# Patient Record
Sex: Female | Born: 1970 | Race: White | Hispanic: No | Marital: Married | Smoking: Current every day smoker
Health system: Southern US, Community
[De-identification: ages and names within clinical notes are randomized; demographics above are authoritative.]

## PROBLEM LIST (undated history)

## (undated) DIAGNOSIS — E119 Type 2 diabetes mellitus without complications: Secondary | ICD-10-CM

## (undated) HISTORY — DX: Type 2 diabetes mellitus without complications: E11.9

---

## 2009-06-22 ENCOUNTER — Ambulatory Visit: Payer: Self-pay | Admitting: Internal Medicine

## 2011-02-18 ENCOUNTER — Ambulatory Visit: Payer: Self-pay

## 2011-05-15 ENCOUNTER — Ambulatory Visit: Payer: Self-pay | Admitting: Internal Medicine

## 2019-07-02 ENCOUNTER — Ambulatory Visit: Payer: Self-pay | Attending: Oncology

## 2019-07-02 DIAGNOSIS — Z23 Encounter for immunization: Secondary | ICD-10-CM

## 2019-07-02 NOTE — Progress Notes (Signed)
   Covid-19 Vaccination Clinic  Name:  Sally Graham    MRN: [id number] DOB: 11-05-1970  07/02/2019  [name] was observed post Covid-19 immunization for 15 minutes without incident. She was provided with Vaccine Information Sheet and instruction to access the V-Safe system.   [name] was instructed to call 911 with any severe reactions post vaccine: Marland Kitchen Difficulty breathing  . Swelling of face and throat  . A fast heartbeat  . A bad rash all over body  . Dizziness and weakness   Immunizations Administered    Name Date Dose VIS Date Route   Pfizer COVID-19 Vaccine 07/02/2019  8:19 AM 0.3 mL 02/26/2019 Intramuscular   Manufacturer: [place]   Lot: [id number]   [place]: [id number]

## 2019-07-24 ENCOUNTER — Ambulatory Visit: Payer: Self-pay | Attending: Internal Medicine

## 2019-07-24 ENCOUNTER — Other Ambulatory Visit: Payer: Self-pay

## 2019-07-24 DIAGNOSIS — Z23 Encounter for immunization: Secondary | ICD-10-CM

## 2019-07-24 NOTE — Progress Notes (Signed)
   Covid-19 Vaccination Clinic  Name:  Sally Graham    MRN: [id number] DOB: 1971/03/02  07/24/2019  [name] was observed post Covid-19 immunization for 15 minutes without incident. She was provided with Vaccine Information Sheet and instruction to access the V-Safe system.   [name] was instructed to call 911 with any severe reactions post vaccine: Marland Kitchen Difficulty breathing  . Swelling of face and throat  . A fast heartbeat  . A bad rash all over body  . Dizziness and weakness   Immunizations Administered    Name Date Dose VIS Date Route   Pfizer COVID-19 Vaccine 07/24/2019 12:40 PM 0.3 mL 05/12/2018 Intramuscular   Manufacturer: [place]   Lot: [id number]   [place]: [id number]

## 2021-04-17 ENCOUNTER — Other Ambulatory Visit: Payer: Self-pay

## 2021-04-17 ENCOUNTER — Ambulatory Visit: Payer: BC Managed Care – PPO | Admitting: Internal Medicine

## 2021-04-17 ENCOUNTER — Ambulatory Visit
Admission: RE | Admit: 2021-04-17 | Discharge: 2021-04-17 | Disposition: A | Discharge status: Home / Self Care | Payer: BC Managed Care – PPO | Source: Ambulatory Visit | Attending: Internal Medicine | Admitting: Internal Medicine

## 2021-04-17 ENCOUNTER — Ambulatory Visit
Admission: RE | Admit: 2021-04-17 | Discharge: 2021-04-17 | Disposition: A | Discharge status: Home / Self Care | Payer: BC Managed Care – PPO | Attending: Internal Medicine | Admitting: Internal Medicine

## 2021-04-17 ENCOUNTER — Encounter: Payer: Self-pay | Admitting: Internal Medicine

## 2021-04-17 VITALS — BP 120/78 | HR 92 | Ht 69.0 in | Wt 200.0 lb

## 2021-04-17 DIAGNOSIS — E11621 Type 2 diabetes mellitus with foot ulcer: Secondary | ICD-10-CM | POA: Diagnosis not present

## 2021-04-17 DIAGNOSIS — Z1159 Encounter for screening for other viral diseases: Secondary | ICD-10-CM

## 2021-04-17 DIAGNOSIS — M7989 Other specified soft tissue disorders: Secondary | ICD-10-CM | POA: Diagnosis not present

## 2021-04-17 DIAGNOSIS — L97511 Non-pressure chronic ulcer of other part of right foot limited to breakdown of skin: Secondary | ICD-10-CM | POA: Diagnosis not present

## 2021-04-17 DIAGNOSIS — E118 Type 2 diabetes mellitus with unspecified complications: Secondary | ICD-10-CM | POA: Diagnosis not present

## 2021-04-17 HISTORY — DX: Type 2 diabetes mellitus with foot ulcer: E11.621

## 2021-04-17 LAB — POCT GLYCOSYLATED HEMOGLOBIN (HGB A1C): Hemoglobin A1C: 11.4 % — AB (ref 4.0–5.6)

## 2021-04-17 MED ORDER — BLOOD GLUCOSE MONITOR KIT: PACK | 0 refills | Status: DC

## 2021-04-17 MED ORDER — METFORMIN HCL ER 500 MG PO TB24: 500.0000 mg | ORAL_TABLET | Freq: Two times a day (BID) | ORAL | 2 refills | Status: DC

## 2021-04-17 MED ORDER — AMOXICILLIN-POT CLAVULANATE 875-125 MG PO TABS: 1.0000 | ORAL_TABLET | Freq: Two times a day (BID) | ORAL | 0 refills | Status: AC

## 2021-04-17 NOTE — Progress Notes (Signed)
Date:  04/17/2021   Name:  Sally Graham   DOB:  1971/02/28   MRN:  [id number]  New patient to establish care.    Chief Complaint: Establish Care and Diabetes (Ulcer on foot,right foot, 1 year, took metformin 10 years ago )  Diabetes She presents for her follow-up diabetic visit. She has type 2 diabetes mellitus. Pertinent negatives for hypoglycemia include no dizziness, headaches or nervousness/anxiousness. Pertinent negatives for diabetes include no chest pain and no fatigue.  Patient has known type 2 diabetes currently untreated.  She reports taking metformin briefly, for about 6 months, 10 years ago.  She previously checked her blood sugars intermittently but has not checked them in some time.  She reports having lost about 70 pounds over an unknown period of time with current weight being stabilized. Her main concern today is an ulcer over the lateral aspect of her right foot.  This began about 6 months ago as a blister which then opened up and became an ulcer.  She has thick callus around the area which has been trying to trim and keep clean.  She has been applying Neosporin and Covering it with a Band-Aid.  She does note that her foot is swollen at the end of the day.  There is been minimal drainage from the ulcer.  She has minimal pain in the area as well.  No results found for: NA, K, CO2, GLUCOSE, BUN, CREATININE, CALCIUM, EGFR, GFRNONAA, GLUCOSE No results found for: CHOL, HDL, LDLCALC, LDLDIRECT, TRIG, CHOLHDL No results found for: TSH Lab Results  Component Value Date   HGBA1C 11.4 (A) 04/17/2021   No results found for: WBC, HGB, HCT, MCV, PLT No results found for: ALT, AST, GGT, ALKPHOS, BILITOT No results found for: 25OHVITD2, 25OHVITD3, VD25OH   Review of Systems  Constitutional:  Positive for unexpected weight change (70 lbs over unknown periods of time). Negative for chills, fatigue and fever.  Respiratory:  Negative for chest tightness and shortness of breath.    Cardiovascular:  Positive for leg swelling. Negative for chest pain and palpitations.  Gastrointestinal:  Negative for abdominal pain, constipation and diarrhea.  Neurological:  Negative for dizziness and headaches.  Psychiatric/Behavioral:  Negative for dysphoric mood and sleep disturbance. The patient is not nervous/anxious.    There are no problems to display for this patient.   Not on File  Past Surgical History:  Procedure Laterality Date   CESAREAN SECTION  2000    Social History   Tobacco Use   Smoking status: Every Day    Packs/day: 0.50    Years: 30.00    Pack years: 15.00    Types: Cigarettes   Smokeless tobacco: Never  Vaping Use   Vaping Use: Never used  Substance Use Topics   Alcohol use: Yes    Alcohol/week: 3.0 - 4.0 standard drinks    Types: 3 - 4 Glasses of wine per week    Comment: occassionally   Drug use: Never     Medication list has been reviewed and updated.  Current Meds  Medication Sig   amoxicillin-clavulanate (AUGMENTIN) 875-125 MG tablet Take 1 tablet by mouth 2 (two) times daily for 10 days.   blood glucose meter kit and supplies KIT Dispense based on patient and insurance preference. Use up to four times daily as directed.   metFORMIN (GLUCOPHAGE XR) 500 MG 24 hr tablet Take 1 tablet (500 mg total) by mouth 2 (two) times daily.    PHQ 2/9  Scores 04/17/2021  PHQ - 2 Score 0  PHQ- 9 Score 0    GAD 7 : Generalized Anxiety Score 04/17/2021  Nervous, Anxious, on Edge 0  Control/stop worrying 0  Worry too much - different things 0  Trouble relaxing 0  Restless 0  Easily annoyed or irritable 0  Afraid - awful might happen 0  Total GAD 7 Score 0    BP Readings from Last 3 Encounters:  04/17/21 120/78     Physical Exam Vitals and nursing note reviewed.  Constitutional:      General: She is not in acute distress.    Appearance: Normal appearance. She is well-developed.  HENT:     Head: Normocephalic and atraumatic.  Neck:      Vascular: No carotid bruit.  Cardiovascular:     Rate and Rhythm: Normal rate and regular rhythm.  Pulmonary:     Effort: Pulmonary effort is normal. No respiratory distress.     Breath sounds: No wheezing or rhonchi.  Abdominal:     General: Abdomen is flat.     Palpations: Abdomen is soft.     Tenderness: There is no abdominal tenderness.     Hernia: No hernia is present.  Musculoskeletal:        General: Normal range of motion.     Cervical back: Normal range of motion.  Lymphadenopathy:     Cervical: No cervical adenopathy.  Skin:    General: Skin is warm and dry.     Findings: No rash.  Neurological:     Mental Status: She is alert and oriented to person, place, and time.  Psychiatric:        Mood and Affect: Mood normal.        Behavior: Behavior normal.    Wt Readings from Last 3 Encounters:  04/17/21 200 lb (90.7 kg)    BP 120/78    Pulse 92    Ht 5' 9"  (1.753 m)    Wt 200 lb (90.7 kg)    SpO2 97%    BMI 29.53 kg/m   Assessment and Plan: 1. Type II diabetes mellitus with complication (HCC) Long standing DM untreated with unknown other sequelae. Will obtain labs and U/Cr. Start metformin daily x 7 days then bid with meals Begin FSBS at home - at least 3 times per week - metFORMIN (GLUCOPHAGE XR) 500 MG 24 hr tablet; Take 1 tablet (500 mg total) by mouth 2 (two) times daily.  Dispense: 60 tablet; Refill: 2 - Comprehensive metabolic panel - Microalbumin / creatinine urine ratio - blood glucose meter kit and supplies KIT; Dispense based on patient and insurance preference. Use up to four times daily as directed.  Dispense: 1 each; Refill: 0 - POCT HgB A1C = 11.4  2. 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  Dispense: 20 tablet; Refill: 0 - CBC with Differential/Platelet - POCT HgB A1C  3. Need for hepatitis C screening test - Hepatitis C antibody   Partially dictated using Editor, commissioning. Any errors are unintentional.  Halina Maidens, MD [facility]  04/17/2021

## 2021-04-18 ENCOUNTER — Other Ambulatory Visit: Payer: Self-pay | Admitting: Internal Medicine

## 2021-04-18 ENCOUNTER — Encounter: Payer: Self-pay | Admitting: Internal Medicine

## 2021-04-18 DIAGNOSIS — E118 Type 2 diabetes mellitus with unspecified complications: Secondary | ICD-10-CM

## 2021-04-18 LAB — CBC WITH DIFFERENTIAL/PLATELET
Basophils Absolute: 0.2 10*3/uL (ref 0.0–0.2)
Basos: 2 %
EOS (ABSOLUTE): 0.2 10*3/uL (ref 0.0–0.4)
Eos: 2 %
Hematocrit: 45.9 % (ref 34.0–46.6)
Hemoglobin: 15.3 g/dL (ref 11.1–15.9)
Immature Grans (Abs): 0 10*3/uL (ref 0.0–0.1)
Immature Granulocytes: 0 %
Lymphocytes Absolute: 3.2 10*3/uL — ABNORMAL HIGH (ref 0.7–3.1)
Lymphs: 31 %
MCH: 30.4 pg (ref 26.6–33.0)
MCHC: 33.3 g/dL (ref 31.5–35.7)
MCV: 91 fL (ref 79–97)
Monocytes Absolute: 0.6 10*3/uL (ref 0.1–0.9)
Monocytes: 6 %
Neutrophils Absolute: 6.1 10*3/uL (ref 1.4–7.0)
Neutrophils: 59 %
Platelets: 338 10*3/uL (ref 150–450)
RBC: 5.04 x10E6/uL (ref 3.77–5.28)
RDW: 12.9 % (ref 11.7–15.4)
WBC: 10.3 10*3/uL (ref 3.4–10.8)

## 2021-04-18 LAB — COMPREHENSIVE METABOLIC PANEL
ALT: 6 IU/L (ref 0–32)
AST: 13 IU/L (ref 0–40)
Albumin/Globulin Ratio: 1.3 (ref 1.2–2.2)
Albumin: 4.5 g/dL (ref 3.8–4.8)
Alkaline Phosphatase: 111 IU/L (ref 44–121)
BUN/Creatinine Ratio: 16 (ref 9–23)
BUN: 13 mg/dL (ref 6–24)
Bilirubin Total: 0.9 mg/dL (ref 0.0–1.2)
CO2: 22 mmol/L (ref 20–29)
Calcium: 9.9 mg/dL (ref 8.7–10.2)
Chloride: 96 mmol/L (ref 96–106)
Creatinine, Ser: 0.82 mg/dL (ref 0.57–1.00)
Globulin, Total: 3.4 g/dL (ref 1.5–4.5)
Glucose: 315 mg/dL — ABNORMAL HIGH (ref 70–99)
Potassium: 4.7 mmol/L (ref 3.5–5.2)
Sodium: 136 mmol/L (ref 134–144)
Total Protein: 7.9 g/dL (ref 6.0–8.5)
eGFR: 88 mL/min/{1.73_m2} (ref >59)

## 2021-04-18 LAB — MICROALBUMIN / CREATININE URINE RATIO
Creatinine, Urine: 94.9 mg/dL
Microalb/Creat Ratio: 52 mg/g creat — ABNORMAL HIGH (ref 0–29)
Microalbumin, Urine: 49.7 ug/mL

## 2021-04-18 LAB — HEPATITIS C ANTIBODY: Hep C Virus Ab: <0.1 s/co ratio (ref 0.0–0.9)

## 2021-04-18 MED ORDER — LISINOPRIL 5 MG PO TABS: 5.0000 mg | ORAL_TABLET | Freq: Every day | ORAL | 0 refills | Status: DC

## 2021-05-17 ENCOUNTER — Ambulatory Visit: Payer: BC Managed Care – PPO | Admitting: Internal Medicine

## 2021-05-23 ENCOUNTER — Ambulatory Visit: Payer: BC Managed Care – PPO | Admitting: Internal Medicine

## 2021-05-30 ENCOUNTER — Other Ambulatory Visit: Payer: Self-pay

## 2021-05-30 ENCOUNTER — Encounter: Payer: Self-pay | Admitting: Internal Medicine

## 2021-05-30 ENCOUNTER — Ambulatory Visit: Payer: BC Managed Care – PPO | Admitting: Internal Medicine

## 2021-05-30 VITALS — BP 136/88 | HR 92 | Ht 69.0 in | Wt 198.6 lb

## 2021-05-30 DIAGNOSIS — E118 Type 2 diabetes mellitus with unspecified complications: Secondary | ICD-10-CM | POA: Diagnosis not present

## 2021-05-30 DIAGNOSIS — L97511 Non-pressure chronic ulcer of other part of right foot limited to breakdown of skin: Secondary | ICD-10-CM | POA: Diagnosis not present

## 2021-05-30 DIAGNOSIS — E11621 Type 2 diabetes mellitus with foot ulcer: Secondary | ICD-10-CM

## 2021-05-30 MED ORDER — LEVOFLOXACIN 500 MG PO TABS: 500.0000 mg | ORAL_TABLET | Freq: Every day | ORAL | 0 refills | Status: AC

## 2021-05-30 MED ORDER — METFORMIN HCL ER 500 MG PO TB24: 500.0000 mg | ORAL_TABLET | Freq: Two times a day (BID) | ORAL | 0 refills | Status: DC

## 2021-05-30 NOTE — Progress Notes (Signed)
? ? ?Date:  05/30/2021  ? ?Name:  Sally Graham   DOB:  1970/07/02   MRN:  [id number] ? ? ?Chief Complaint: Diabetes and Foot Injury (Ulcer. ) ? ?Diabetes ?She presents for her follow-up diabetic visit. She has type 2 diabetes mellitus. The initial diagnosis of diabetes was made 6 months ago. Pertinent negatives for hypoglycemia include no headaches or tremors. Associated symptoms include foot ulcerations and weight loss. Pertinent negatives for diabetes include no chest pain, no fatigue, no polydipsia and no polyuria. Current diabetic treatment includes oral agent (monotherapy) (metformin bid started last visit). She is following a generally healthy diet. There is no compliance with monitoring of blood glucose. An ACE inhibitor/angiotensin II receptor blocker is being taken.  ? ?Lab Results  ?Component Value Date  ? NA 136 04/17/2021  ? K 4.7 04/17/2021  ? CO2 22 04/17/2021  ? GLUCOSE 315 (H) 04/17/2021  ? BUN 13 04/17/2021  ? CREATININE 0.82 04/17/2021  ? CALCIUM 9.9 04/17/2021  ? EGFR 88 04/17/2021  ? ?No results found for: CHOL, HDL, LDLCALC, LDLDIRECT, TRIG, CHOLHDL ?No results found for: TSH ?Lab Results  ?Component Value Date  ? HGBA1C 11.4 (A) 04/17/2021  ? ?Lab Results  ?Component Value Date  ? WBC 10.3 04/17/2021  ? HGB 15.3 04/17/2021  ? HCT 45.9 04/17/2021  ? MCV 91 04/17/2021  ? PLT 338 04/17/2021  ? ?Lab Results  ?Component Value Date  ? ALT 6 04/17/2021  ? AST 13 04/17/2021  ? ALKPHOS 111 04/17/2021  ? BILITOT 0.9 04/17/2021  ? ?No results found for: 25OHVITD2, [place], VD25OH  ? ?Review of Systems  ?Constitutional:  Positive for weight loss. Negative for appetite change, fatigue, fever and unexpected weight change.  ?HENT:  Negative for tinnitus and trouble swallowing.   ?Eyes:  Negative for visual disturbance.  ?Respiratory:  Negative for cough, chest tightness and shortness of breath.   ?Cardiovascular:  Negative for chest pain, palpitations and leg swelling.  ?Gastrointestinal:  Negative for  abdominal pain.  ?Endocrine: Negative for polydipsia and polyuria.  ?Genitourinary:  Negative for dysuria and hematuria.  ?Musculoskeletal:  Negative for arthralgias.  ?Neurological:  Negative for tremors, numbness and headaches.  ?Psychiatric/Behavioral:  Negative for dysphoric mood.   ? ?Patient Active Problem List  ? Diagnosis Date Noted  ? Type II diabetes mellitus with complication ([place]) 61/44/3154  ? Diabetic foot ulcer ([place]) 04/17/2021  ? ? ?Not on File ? ?Past Surgical History:  ?Procedure Laterality Date  ? CESAREAN SECTION  2000  ? ? ?Social History  ? ?Tobacco Use  ? Smoking status: Every Day  ?  Packs/day: 0.50  ?  Years: 30.00  ?  Pack years: 15.00  ?  Types: Cigarettes  ? Smokeless tobacco: Never  ?Vaping Use  ? Vaping Use: Never used  ?Substance Use Topics  ? Alcohol use: Yes  ?  Alcohol/week: 3.0 - 4.0 standard drinks  ?  Types: 3 - 4 Glasses of wine per week  ?  Comment: occassionally  ? Drug use: Never  ? ? ? ?Medication list has been reviewed and updated. ? ?Current Meds  ?Medication Sig  ? ACCU-CHEK GUIDE test strip USE TO CHECK BLOOD SUGAR UP TO 4 TIMES DAILY  ? Accu-Chek Softclix Lancets lancets SMARTSIG:Topical 1-4 Times Daily  ? CINNAMON PO Take by mouth.  ? levofloxacin (LEVAQUIN) 500 MG tablet Take 1 tablet (500 mg total) by mouth daily for 7 days.  ? lisinopril (ZESTRIL) 5 MG tablet Take 1 tablet (  5 mg total) by mouth daily.  ? Multiple Vitamins-Minerals (MULTIVIT/MULTIMINERAL ADULT PO) Take by mouth.  ? Omega-3 Fatty Acids (FISH OIL) 1000 MG CAPS Take by mouth.  ? [DISCONTINUED] metFORMIN (GLUCOPHAGE XR) 500 MG 24 hr tablet Take 1 tablet (500 mg total) by mouth 2 (two) times daily.  ? ? ?PHQ 2/9 Scores 05/30/2021 04/17/2021  ?PHQ - 2 Score 0 0  ?PHQ- 9 Score 0 0  ? ? ?GAD 7 : Generalized Anxiety Score 05/30/2021 04/17/2021  ?Nervous, Anxious, on Edge 0 0  ?Control/stop worrying 0 0  ?Worry too much - different things 0 0  ?Trouble relaxing 0 0  ?Restless 0 0  ?Easily annoyed or irritable 0 0   ?Afraid - awful might happen 0 0  ?Total GAD 7 Score 0 0  ?Anxiety Difficulty Not difficult at all -  ? ? ?BP Readings from Last 3 Encounters:  ?05/30/21 136/88  ?04/17/21 120/78  ? ? ?Physical Exam ?Vitals and nursing note reviewed.  ?Constitutional:   ?   General: She is not in acute distress. ?   Appearance: Normal appearance. She is well-developed.  ?HENT:  ?   Head: Normocephalic and atraumatic.  ?Cardiovascular:  ?   Rate and Rhythm: Normal rate and regular rhythm.  ?   Pulses: Normal pulses.  ?   Heart sounds: No murmur heard. ?Pulmonary:  ?   Effort: Pulmonary effort is normal. No respiratory distress.  ?   Breath sounds: No wheezing or rhonchi.  ?Musculoskeletal:  ?   Cervical back: Normal range of motion.  ?   Right lower leg: No edema.  ?   Left lower leg: No edema.  ?     Feet: ? ?Feet:  ?   Right foot:  ?   Skin integrity: Ulcer, skin breakdown and callus present.  ?Skin: ?   General: Skin is warm and dry.  ?   Capillary Refill: Capillary refill takes less than 2 seconds.  ?   Findings: No rash.  ?Neurological:  ?   General: No focal deficit present.  ?   Mental Status: She is alert and oriented to person, place, and time.  ?Psychiatric:     ?   Mood and Affect: Mood normal.     ?   Behavior: Behavior normal.  ? ? ?Wt Readings from Last 3 Encounters:  ?05/30/21 198 lb 9.6 oz (90.1 kg)  ?04/17/21 200 lb (90.7 kg)  ? ? ?BP 136/88 (BP Location: Right Arm, Cuff Size: Large)   Pulse 92   Ht 5' 9"  (1.753 m)   Wt 198 lb 9.6 oz (90.1 kg)   SpO2 99%   BMI 29.33 kg/m?  ? ?Assessment and Plan: ?1. Type II diabetes mellitus with complication ([place]) ?She has been trying to work on her diet but has not been checking her blood sugars ?Continue metformin and diet ?Will get labs next visit ?- metFORMIN (GLUCOPHAGE XR) 500 MG 24 hr tablet; Take 1 tablet (500 mg total) by mouth 2 (two) times daily.  Dispense: 180 tablet; Refill: 0 ? ?2. Diabetic ulcer of other part of right foot associated with type 2 diabetes  mellitus, limited to breakdown of skin ([place]) ?Ulcer is unchanged; X-rays showed no osteomyelitis ?Will treat with a week of Levaquin and refer to the wound clinic ?- levofloxacin (LEVAQUIN) 500 MG tablet; Take 1 tablet (500 mg total) by mouth daily for 7 days.  Dispense: 7 tablet; Refill: 0 ?- Ambulatory referral to Wound Clinic ? ? ?  Partially dictated using Editor, commissioning. Any errors are unintentional. ? ?Halina Maidens, MD ?[facility] ?[facility] ? ?05/30/2021 ? ? ? ? ? ?

## 2021-05-31 ENCOUNTER — Telehealth: Payer: Self-pay

## 2021-05-31 NOTE — Telephone Encounter (Signed)
Sally Graham from would care called and stated that she had offered the patient an appointment for 06/12/2021 but she declined that visit due to having too much going on. Pt scheduled an appointment for 06/26/2021. She just wanted [place] to be aware that patient pushed the appointment back when offered a sooner appointment. ? ?KP ?

## 2021-06-06 ENCOUNTER — Telehealth: Payer: Self-pay

## 2021-06-06 NOTE — Telephone Encounter (Signed)
Patient needs to schedule a physical with PAP around August. Thank you. ?

## 2021-06-09 ENCOUNTER — Other Ambulatory Visit: Payer: Self-pay | Admitting: Internal Medicine

## 2021-06-11 NOTE — Telephone Encounter (Signed)
Requested medication (s) are due for refill today:Yes ? ?Requested medication (s) are on the active medication list: Yes ? ?Last refill:  04/24/21 ? ?Future visit scheduled: Yes ? ?Notes to clinic:  Unable to refill per protocol, last refill by another provider. ? ? ? ? ? ?Requested Prescriptions  ?Pending Prescriptions Disp Refills  ? Accu-Chek Softclix Lancets lancets [Pharmacy Med Name: ACCU-CHEK SOFTCLIX LANCETS] 100 each   ?  Sig: TEST UP TO FOUR TIMES DAILY  ?  ? Endocrinology: Diabetes - Testing Supplies Passed - 06/09/2021 11:01 AM  ?  ?  Passed - Valid encounter within last 12 months  ?  Recent Outpatient Visits   ? ?      ? 1 week ago Type II diabetes mellitus with complication ([place])  ? [facility] Glean Hess, MD  ? 1 month ago Type II diabetes mellitus with complication [facility])  ? [facility] Glean Hess, MD  ? ?  ?  ?Future Appointments   ? ?        ? In 1 month Army Melia Jesse Sans, MD [facility], [place]  ? In 5 months Army Melia Jesse Sans, MD [facility], [place]  ? ?  ? ?  ?  ?  ? ? ? ? ?

## 2021-06-26 ENCOUNTER — Encounter: Payer: BC Managed Care – PPO | Attending: Physician Assistant | Admitting: Physician Assistant

## 2021-06-26 DIAGNOSIS — L84 Corns and callosities: Secondary | ICD-10-CM | POA: Diagnosis not present

## 2021-06-26 DIAGNOSIS — E11621 Type 2 diabetes mellitus with foot ulcer: Secondary | ICD-10-CM | POA: Diagnosis not present

## 2021-06-26 DIAGNOSIS — Z7984 Long term (current) use of oral hypoglycemic drugs: Secondary | ICD-10-CM | POA: Insufficient documentation

## 2021-06-26 DIAGNOSIS — L97512 Non-pressure chronic ulcer of other part of right foot with fat layer exposed: Secondary | ICD-10-CM | POA: Diagnosis not present

## 2021-06-26 DIAGNOSIS — E1142 Type 2 diabetes mellitus with diabetic polyneuropathy: Secondary | ICD-10-CM | POA: Insufficient documentation

## 2021-06-26 DIAGNOSIS — F17210 Nicotine dependence, cigarettes, uncomplicated: Secondary | ICD-10-CM | POA: Diagnosis not present

## 2021-06-26 NOTE — Progress Notes (Signed)
DALTON, MILLE B. ([id number]) ?Visit Report for 06/26/2021 ?Abuse Risk Screen Details ?Patient Name: Sally Graham, Sally Graham ?Date of Service: 06/26/2021 12:45 PM ?Medical Record Number: [id number] ?Patient Account Number: [id number] ?Date of Birth/Sex: September 15, 1970 (51 y.o. F) ?Treating RN: Levora Dredge ?Primary Care Shaquasia Caponigro: Halina Maidens Other Clinician: ?Referring Kaizley Aja: Halina Maidens ?Treating Ciel Chervenak/Extender: Jeri Cos ?Weeks in Treatment: 0 ?Abuse Risk Screen Items ?Answer ?ABUSE RISK SCREEN: ?Has anyone close to you tried to hurt or harm you recentlyo No ?Do you feel uncomfortable with anyone in your familyo No ?Has anyone forced you do things that you didnot want to doo No ?Electronic Signature(s) ?Signed: 06/26/2021 4:51:41 PM By: Levora Dredge ?Entered By: Levora Dredge on 06/26/2021 12:57:48 ?Tremblay, Woodrow B. ([id number]) ?-------------------------------------------------------------------------------- ?Activities of Daily Living Details ?Patient Name: Sally Graham, Sally Graham ?Date of Service: 06/26/2021 12:45 PM ?Medical Record Number: [id number] ?Patient Account Number: [id number] ?Date of Birth/Sex: [birth year] (51 y.o. F) ?Treating RN: Levora Dredge ?Primary Care Neesa Knapik: Halina Maidens Other Clinician: ?Referring Patsy Varma: Halina Maidens ?Treating Ripley Lovecchio/Extender: Jeri Cos ?Weeks in Treatment: 0 ?Activities of Daily Living Items ?Answer ?Activities of Daily Living (Please select one for each item) ?[place] ?Take Medications Completely Able ?Use Telephone Completely Able ?Care for Appearance Completely Able ?Use Toilet Completely Able ?Bath / Shower Completely Able ?Dress Self Completely Able ?Feed Self Completely Able ?Walk Completely Able ?Get In / Out Bed Completely Able ?Housework Completely Able ?Prepare Meals Completely Able ?Handle Money Completely Able ?Shop for Self Completely Able ?Electronic Signature(s) ?Signed: 06/26/2021 4:51:41 PM By: Levora Dredge ?Entered By:  Levora Dredge on 06/26/2021 12:58:08 ?Estock, Kylea B. ([id number]) ?-------------------------------------------------------------------------------- ?Education Screening Details ?Patient Name: Sally Graham, Sally Graham ?Date of Service: 06/26/2021 12:45 PM ?Medical Record Number: [id number] ?Patient Account Number: [id number] ?Date of Birth/Sex: 1970/07/14 (51 y.o. F) ?Treating RN: Levora Dredge ?Primary Care Robley Matassa: Halina Maidens Other Clinician: ?Referring Draycen Leichter: Halina Maidens ?Treating Luka Stohr/Extender: Jeri Cos ?Weeks in Treatment: 0 ?Learning Preferences/Education Level/Primary Language ?Learning Preference: Explanation, Demonstration, Video, Communication Board, Printed Material ?Preferred Language: English ?Cognitive Barrier ?Language Barrier: No ?Translator Needed: No ?Memory Deficit: No ?Emotional Barrier: No ?Cultural/Religious Beliefs Affecting Medical Care: No ?Physical Barrier ?Impaired Vision: No ?Impaired Hearing: No ?Decreased Hand dexterity: No ?Knowledge/Comprehension ?Knowledge Level: High ?Comprehension Level: High ?Ability to understand written instructions: High ?Ability to understand verbal instructions: High ?Motivation ?Anxiety Level: Calm ?Cooperation: Cooperative ?Education Importance: Acknowledges Need ?Interest in Health Problems: Asks Questions ?Perception: Coherent ?Willingness to Engage in Self-Management ?High ?Activities: ?Readiness to Engage in Self-Management ?High ?Activities: ?Electronic Signature(s) ?Signed: 06/26/2021 4:51:41 PM By: Levora Dredge ?Entered By: Levora Dredge on 06/26/2021 12:58:30 ?Reiland, Elton B. ([id number]) ?-------------------------------------------------------------------------------- ?Fall Risk Assessment Details ?Patient Name: Sally Graham, Sally Graham ?Date of Service: 06/26/2021 12:45 PM ?Medical Record Number: [id number] ?Patient Account Number: [id number] ?Date of Birth/Sex: [birth year] (51 y.o. F) ?Treating RN: Levora Dredge ?Primary Care Oluwatomisin Deman: Halina Maidens Other Clinician: ?Referring Rafel Garde: Halina Maidens ?Treating Declyn Offield/Extender: Jeri Cos ?Weeks in Treatment: 0 ?Fall Risk Assessment Items ?Have you had 2 or more falls in the last 12 monthso 0 No ?Have you had any fall that resulted in injury in the last 12 monthso 0 No ?FALLS RISK SCREEN ?History of falling - immediate or within 3 months 0 No ?Secondary diagnosis (Do you have 2 or more medical diagnoseso) 0 No ?Ambulatory aid ?None/bed rest/wheelchair/nurse 0 Yes ?Crutches/cane/walker 0 No ?Furniture 0 No ?Intravenous therapy Access/Saline/Heparin Lock 0 No ?Gait/Transferring ?Normal/ bed rest/ wheelchair 0 Yes ?Weak (short steps with or without shuffle, stooped but able to  lift head while walking, may ?0 No ?seek support from furniture) ?Impaired (short steps with shuffle, may have difficulty arising from chair, head down, impaired ?0 No ?balance) ?Mental Status ?Oriented to own ability 0 Yes ?Electronic Signature(s) ?Signed: 06/26/2021 4:51:41 PM By: Levora Dredge ?Entered By: Levora Dredge on 06/26/2021 12:58:48 ?Vara, Marshe B. ([id number]) ?-------------------------------------------------------------------------------- ?Foot Assessment Details ?Patient Name: Sally Graham, Sally Graham ?Date of Service: 06/26/2021 12:45 PM ?Medical Record Number: [id number] ?Patient Account Number: [id number] ?Date of Birth/Sex: [birth year] (51 y.o. F) ?Treating RN: Levora Dredge ?Primary Care Angalena Cousineau: Halina Maidens Other Clinician: ?Referring Lauraann Missey: Halina Maidens ?Treating Caera Enwright/Extender: Jeri Cos ?Weeks in Treatment: 0 ?Foot Assessment Items ?Site Locations ?+ = Sensation present, - = Sensation absent, C = Callus, U = Ulcer ?R = Redness, W = Warmth, M = Maceration, PU = Pre-ulcerative lesion ?F = Fissure, S = Swelling, D = Dryness ?Assessment ?Right: Left: ?Other Deformity: No No ?Prior Foot Ulcer: No No ?Prior Amputation: No No ?Charcot Joint: No No ?Ambulatory Status: Ambulatory Without Help ?Gait:  Steady ?Electronic Signature(s) ?Signed: 06/26/2021 4:51:41 PM By: Levora Dredge ?Entered By: Levora Dredge on 06/26/2021 13:03:15 ?Marcello, Fumie B. ([id number]) ?-------------------------------------------------------------------------------- ?Nutrition Risk Screening Details ?Patient Name: Sally Graham, Sally Graham ?Date of Service: 06/26/2021 12:45 PM ?Medical Record Number: [id number] ?Patient Account Number: [id number] ?Date of Birth/Sex: 1970/04/17 (51 y.o. F) ?Treating RN: Levora Dredge ?Primary Care Nelia Rogoff: Halina Maidens Other Clinician: ?Referring Umeka Wrench: Halina Maidens ?Treating Stuart Mirabile/Extender: Jeri Cos ?Weeks in Treatment: 0 ?Height (in): ?Weight (lbs): ?Body Mass Index (BMI): ?Nutrition Risk Screening Items ?Score Screening ?NUTRITION RISK SCREEN: ?I have an illness or condition that made me change the kind and/or amount of food I eat 0 No ?I eat fewer than two meals per day 0 No ?I eat few fruits and vegetables, or milk products 0 No ?I have three or more drinks of beer, liquor or wine almost every day 0 No ?I have tooth or mouth problems that make it hard for me to eat 0 No ?I don't always have enough money to buy the food I need 0 No ?I eat alone most of the time 0 No ?I take three or more different prescribed or over-the-counter drugs a day 0 No ?Without wanting to, I have lost or gained 10 pounds in the last six months 0 No ?I am not always physically able to shop, cook and/or feed myself 0 No ?Nutrition Protocols ?Good Risk Protocol 0 No interventions needed ?Moderate Risk Protocol ?High Risk Proctocol ?Risk Level: Good Risk ?Score: 0 ?Electronic Signature(s) ?Signed: 06/26/2021 4:51:41 PM By: Levora Dredge ?Entered By: Levora Dredge on 06/26/2021 12:58:58 ?

## 2021-06-26 NOTE — Progress Notes (Addendum)
[name], [name] ([id number]) ?Visit Report for 06/26/2021 ?Chief Complaint Document Details ?Patient Name: Sally Graham, Sally Graham ?Date of Service: 06/26/2021 12:45 PM ?Medical Record Number: [id number] ?Patient Account Number: [id number] ?Date of Birth/Sex: [birth year] (51 y.o. F) ?Treating RN: Levora Dredge ?Primary Care Provider: Halina Maidens Other Clinician: ?Referring Provider: Halina Maidens ?Treating Provider/Extender: Jeri Cos ?Weeks in Treatment: 0 ?Information Obtained from: Patient ?Chief Complaint ?Right foot ulcer ?Electronic Signature(s) ?Signed: 06/26/2021 1:24:26 PM By: Worthy Keeler PA-C ?Entered By: Worthy Keeler on 06/26/2021 13:24:26 ?Titzer, Sally B. ([id number]) ?-------------------------------------------------------------------------------- ?Debridement Details ?Patient Name: Sally Graham, Sally Graham ?Date of Service: 06/26/2021 12:45 PM ?Medical Record Number: [id number] ?Patient Account Number: [id number] ?Date of Birth/Sex: 02/19/1971 (51 y.o. F) ?Treating RN: Levora Dredge ?Primary Care Provider: Halina Maidens Other Clinician: ?Referring Provider: Halina Maidens ?Treating Provider/Extender: Jeri Cos ?Weeks in Treatment: 0 ?Debridement Performed for ?Wound #1 Right,Lateral,Posterior Foot ?Assessment: ?Performed By: Physician Tommie Sams., PA-C ?Debridement Type: Debridement ?Severity of Tissue Pre Debridement: Fat layer exposed ?Level of Consciousness (Pre- ?Awake and Alert ?procedure): ?Pre-procedure Verification/Time Out ?Yes - 13:28 ?Taken: ?Total Area Debrided (L x W): 0.4 (cm) x 0.4 (cm) = 0.16 (cm?) ?Tissue and other material ?Viable, Non-Viable, Callus, Slough, Subcutaneous, Hatley ?debrided: ?Level: Skin/Subcutaneous Tissue ?Debridement Description: Excisional ?Instrument: Curette ?Bleeding: Minimum ?Hemostasis Achieved: Pressure ?Response to Treatment: Procedure was tolerated well ?Level of Consciousness (Post- ?Awake and Alert ?procedure): ?Post Debridement Measurements of Total  Wound ?Length: (cm) 0.8 ?Width: (cm) 0.5 ?Depth: (cm) 0.2 ?Volume: (cm?) 0.063 ?Character of Wound/Ulcer Post Debridement: Stable ?Severity of Tissue Post Debridement: Fat layer exposed ?Post Procedure Diagnosis ?Same as Pre-procedure ?Electronic Signature(s) ?Signed: 06/26/2021 4:51:41 PM By: Levora Dredge ?Signed: 06/26/2021 5:16:21 PM By: Worthy Keeler PA-C ?Entered By: Levora Dredge on 06/26/2021 13:40:33 ?Sally Graham, Sally B. ([id number]) ?-------------------------------------------------------------------------------- ?HPI Details ?Patient Name: Sally Graham, Sally Graham ?Date of Service: 06/26/2021 12:45 PM ?Medical Record Number: [id number] ?Patient Account Number: [id number] ?Date of Birth/Sex: October 13, 1970 (51 y.o. F) ?Treating RN: Levora Dredge ?Primary Care Provider: Halina Maidens Other Clinician: ?Referring Provider: Halina Maidens ?Treating Provider/Extender: Jeri Cos ?Weeks in Treatment: 0 ?History of Present Illness ?HPI Description: 06-26-2021 upon evaluation today patient presents for a wound over the plantar aspect of her right lateral foot and the fifth ?metatarsal area. She tells me that this is a longstanding issue which has been developing over quite some time. Although the first time she ever ?noted an open wound was somewhere around November 16, 2020. Nonetheless she has been fighting dealing with callus over this area for quite ?some time. She tells me currently that this overall she is gradually. She has tried multiple things to try to get rid of the callus that she just did not ?want it on there. She in fact used a callus remover it was only supposed down for 6 hours she tells me she left it on for 12 and that seems to have ?exacerbated things to some degree. The patient also is a diabetic and really has not been rightly controlled due to not having insurance. She just ?got back on the metformin March 2023. Her most recent hemoglobin A1c on April 17, 2021 was 11.4. Of note she is a daily smoker she  also ?was on Levaquin for 1 week starting on March 15 that is complete at this time. She did have an x-ray on April 18, 2021 which was negative for ?osteo-. Overall she really just feels like that the callus needs to be removed and what it was underneath treated. She  is also unsure what to do to ?try to prevent the callus from continued to build. ?The patient does have a history of cigarette smoking which I will delay the conversation with her till the next visit as there was a lot going on ?today just dealing with the wounds themselves. Nonetheless this is definitely something that needs to be addressed. 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 ?Electronic Signature(s) ?Signed: 06/27/2021 8:42:25 AM By: Worthy Keeler PA-C ?Previous Signature: 06/26/2021 5:18:15 PM Version By: Worthy Keeler PA-C ?Entered By: Worthy Keeler on 06/27/2021 08:42:25 ?Sally Graham, Sally B. ([id number]) ?-------------------------------------------------------------------------------- ?Physical Exam Details ?Patient Name: Sally Graham, Sally Graham ?Date of Service: 06/26/2021 12:45 PM ?Medical Record Number: [id number] ?Patient Account Number: [id number] ?Date of Birth/Sex: 1970/04/23 (51 y.o. F) ?Treating RN: Levora Dredge ?Primary Care Provider: Halina Maidens Other Clinician: ?Referring Provider: Halina Maidens ?Treating Provider/Extender: Jeri Cos ?Weeks in Treatment: 0 ?Constitutional ?sitting or standing blood pressure is within target range for patient.. pulse regular and within target range for patient.Marland Kitchen respirations regular, non- ?labored and within target range for patient.Marland Kitchen temperature within target range for patient.. Well-nourished and well-hydrated in no acute distress. ?Eyes ?conjunctiva clear no eyelid edema noted. pupils equal round and reactive to light and accommodation. ?Ears, Nose, Mouth, and Throat ?no gross abnormality of ear auricles or external auditory  canals. normal hearing noted during conversation. mucus membranes moist. ?Respiratory ?normal breathing without difficulty. ?Cardiovascular ?2+ dorsalis pedis/posterior tibialis pulses. no clubbing, cyanosis, significant edema, <3 sec cap refill. ?Musculoskeletal ?normal gait and posture. no significant deformity or arthritic changes, no loss or range of motion, no clubbing. ?Psychiatric ?this patient is able to make decisions and demonstrates good insight into disease process. Alert and Oriented x 3. pleasant and cooperative. ?Notes ?Upon inspection patient's wound bed actually showed signs of significant callus buildup in fact I had to perform a aggressive debridement in order ?to clear away some of the necrotic tissue including thickened and very extensive callus. I also removed necrotic tissue down to good subcutaneous ?tissue which the patient tolerated as well. Fortunately I do not see any signs of active infection based on what I am seeing the wound was also not ?very deep once I got to the bottom of the callus it appeared to be doing quite well. My hope is that we will be able to get this healed without any ?complications or concerns. ?Electronic Signature(s) ?Signed: 06/27/2021 8:43:07 AM By: Worthy Keeler PA-C ?Entered By: Worthy Keeler on 06/27/2021 08:43:07 ?Sally Graham, Sally B. ([id number]) ?-------------------------------------------------------------------------------- ?Physician Orders Details ?Patient Name: Sally Graham, Sally Graham ?Date of Service: 06/26/2021 12:45 PM ?Medical Record Number: [id number] ?Patient Account Number: [id number] ?Date of Birth/Sex: 02/07/1971 (51 y.o. 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

## 2021-06-26 NOTE — Progress Notes (Signed)
Sally Graham, Sally B. ([id number]) ?Visit Report for 06/26/2021 ?Allergy List Details ?Patient Name: Sally Graham, Sally Graham ?Date of Service: 06/26/2021 12:45 PM ?Medical Record Number: [id number] ?Patient Account Number: [id number] ?Date of Birth/Sex: [birth year] (51 y.o. F) ?Treating RN: Levora Dredge ?Primary Care Armari Fussell: Halina Maidens Other Clinician: ?Referring Shaivi Rothschild: Halina Maidens ?Treating Deaysia Grigoryan/Extender: Jeri Cos ?Weeks in Treatment: 0 ?Allergies ?Active Allergies ?No Known Drug Allergies ?Allergy Notes ?Electronic Signature(s) ?Signed: 06/26/2021 4:51:41 PM By: Levora Dredge ?Entered By: Levora Dredge on 06/26/2021 12:54:15 ?Wyman, Nasia B. ([id number]) ?-------------------------------------------------------------------------------- ?Arrival Information Details ?Patient Name: Sally Graham, Sally Graham ?Date of Service: 06/26/2021 12:45 PM ?Medical Record Number: [id number] ?Patient Account Number: [id number] ?Date of Birth/Sex: 1970-10-14 (51 y.o. F) ?Treating RN: Levora Dredge ?Primary Care Tsering Leaman: Halina Maidens Other Clinician: ?Referring Shayley Medlin: Halina Maidens ?Treating Willmer Fellers/Extender: Jeri Cos ?Weeks in Treatment: 0 ?Visit Information ?Patient Arrived: Ambulatory ?Arrival Time: 12:52 ?Accompanied By: self ?Transfer Assistance: None ?Patient Identification Verified: Yes ?Secondary Verification Process Completed: Yes ?Patient Has Alerts: Yes ?Patient Alerts: TYPE 2 DIABETIC ?Electronic Signature(s) ?Signed: 06/26/2021 4:51:41 PM By: Levora Dredge ?Entered By: Levora Dredge on 06/26/2021 12:53:10 ?Langer, Polly B. ([id number]) ?-------------------------------------------------------------------------------- ?Clinic Level of Care Assessment Details ?Patient Name: Sally Graham, Sally Graham ?Date of Service: 06/26/2021 12:45 PM ?Medical Record Number: [id number] ?Patient Account Number: [id number] ?Date of Birth/Sex: Feb 12, 1971 (51 y.o. F) ?Treating RN: Levora Dredge ?Primary Care Necole Minassian: Halina Maidens Other  Clinician: ?Referring Haly Feher: Halina Maidens ?Treating Levonte Molina/Extender: Jeri Cos ?Weeks in Treatment: 0 ?Clinic Level of Care Assessment Items ?TOOL 1 Quantity Score ?'[]'$  - Use when EandM and Procedure is performed on INITIAL visit 0 ?ASSESSMENTS - Nursing Assessment / Reassessment ?X - General Physical Exam (combine w/ comprehensive assessment (listed just below) when performed on new ?1 20 ?pt. evals) ?X- 1 25 ?Comprehensive Assessment (HX, ROS, Risk Assessments, Wounds Hx, etc.) ?ASSESSMENTS - Wound and Skin Assessment / Reassessment ?'[]'$  - Dermatologic / Skin Assessment (not related to wound area) 0 ?ASSESSMENTS - Ostomy and/or Continence Assessment and Care ?'[]'$  - Incontinence Assessment and Management 0 ?'[]'$  - 0 ?Ostomy Care Assessment and Management (repouching, etc.) ?PROCESS - Coordination of Care ?X - Simple Patient / Family Education for ongoing care 1 15 ?'[]'$  - 0 ?Complex (extensive) Patient / Family Education for ongoing care ?X- 1 10 ?Staff obtains Consents, Records, Test Results / Process Orders ?'[]'$  - 0 ?Staff telephones HHA, Nursing Homes / Clarify orders / etc ?'[]'$  - 0 ?Routine Transfer to another Facility (non-emergent condition) ?'[]'$  - 0 ?Routine Hospital Admission (non-emergent condition) ?X- 1 15 ?New Admissions / Biomedical engineer / Ordering NPWT, Apligraf, etc. ?'[]'$  - 0 ?Emergency Hospital Admission (emergent condition) ?PROCESS - Special Needs ?'[]'$  - Pediatric / Minor Patient Management 0 ?'[]'$  - 0 ?Isolation Patient Management ?'[]'$  - 0 ?Hearing / Language / Visual special needs ?'[]'$  - 0 ?Assessment of Community assistance (transportation, D/C planning, etc.) ?'[]'$  - 0 ?Additional assistance / Altered mentation ?'[]'$  - 0 ?Support Surface(s) Assessment (bed, cushion, seat, etc.) ?INTERVENTIONS - Miscellaneous ?'[]'$  - External ear exam 0 ?'[]'$  - 0 ?Patient Transfer (multiple staff / Civil Service fast streamer / Similar devices) ?'[]'$  - 0 ?Simple Staple / Suture removal (25 or less) ?'[]'$  - 0 ?Complex Staple / Suture  removal (26 or more) ?'[]'$  - 0 ?Hypo/Hyperglycemic Management (do not check if billed separately) ?X- 1 15 ?Ankle / Brachial Index (ABI) - do not check if billed separately ?Has the patient been seen at the hospital within the last three years: Yes ?Total Score: 100 ?Level Of Care: New/Established -  Level ?3 ?Sally Graham, Sally B. ([id number]) ?Electronic Signature(s) ?Signed: 06/26/2021 4:51:41 PM By: Levora Dredge ?Entered By: Levora Dredge on 06/26/2021 13:52:53 ?Sally Graham, Sally B. ([id number]) ?-------------------------------------------------------------------------------- ?Encounter Discharge Information Details ?Patient Name: Sally Graham, Sally Graham ?Date of Service: 06/26/2021 12:45 PM ?Medical Record Number: [id number] ?Patient Account Number: [id number] ?Date of Birth/Sex: Feb 01, 1971 (51 y.o. F) ?Treating RN: Levora Dredge ?Primary Care Belisa Eichholz: Halina Maidens Other Clinician: ?Referring Deztiny Sarra: Halina Maidens ?Treating Adelfo Diebel/Extender: Jeri Cos ?Weeks in Treatment: 0 ?Encounter Discharge Information Items Post Procedure Vitals ?Discharge Condition: Stable ?Temperature (?F): 98.4 ?Ambulatory Status: Ambulatory ?Pulse (bpm): 96 ?Discharge Destination: Home ?Respiratory Rate (breaths/min): 18 ?Transportation: Private Auto ?Blood Pressure (mmHg): 142/82 ?Accompanied By: husband ?Schedule Follow-up Appointment: Yes ?Clinical Summary of Care: Patient Declined ?Electronic Signature(s) ?Signed: 06/26/2021 4:51:41 PM By: Levora Dredge ?Entered By: Levora Dredge on 06/26/2021 13:54:16 ?Sally Graham, Sally B. ([id number]) ?-------------------------------------------------------------------------------- ?Lower Extremity Assessment Details ?Patient Name: Sally Graham, Sally Graham ?Date of Service: 06/26/2021 12:45 PM ?Medical Record Number: [id number] ?Patient Account Number: [id number] ?Date of Birth/Sex: Apr 07, 1970 (51 y.o. F) ?Treating RN: Levora Dredge ?Primary Care Kerina Simoneau: Halina Maidens Other Clinician: ?Referring Arbie Reisz: Halina Maidens ?Treating Emiley Digiacomo/Extender: Jeri Cos ?Weeks in Treatment: 0 ?Edema Assessment ?Assessed: [Left: No] [Right: No] ?Edema: [Left: Ye] [Right: s] ?Calf ?Left: Right: ?Point of Measurement: 33 cm From Medial Instep 38.5 cm ?Ankle ?Left: Right: ?Point of Measurement: 10 cm From Medial Instep 25.5 cm ?Vascular Assessment ?Pulses: ?Dorsalis Pedis ?Palpable: [Right:Yes] ?Posterior Tibial ?Doppler Audible: [Right:Yes] ?Blood Pressure: ?Brachial: [Right:142] ?Dorsalis Pedis: 120 ?Ankle: ?Posterior Tibial: 130 ?Ankle Brachial Index: [Right:0.92] ?Electronic Signature(s) ?Signed: 06/26/2021 4:51:41 PM By: Levora Dredge ?Entered By: Levora Dredge on 06/26/2021 13:14:01 ?Sally Graham, Sally B. ([id number]) ?-------------------------------------------------------------------------------- ?Multi Wound Chart Details ?Patient Name: Sally Graham, Sally Graham ?Date of Service: 06/26/2021 12:45 PM ?Medical Record Number: [id number] ?Patient Account Number: [id number] ?Date of Birth/Sex: [birth year] (51 y.o. F) ?Treating RN: Levora Dredge ?Primary Care Khian Remo: Halina Maidens Other Clinician: ?Referring Hortensia Duffin: Halina Maidens ?Treating Fabricio Endsley/Extender: Jeri Cos ?Weeks in Treatment: 0 ?Vital Signs ?Height(in): ?Pulse(bpm): 96 ?Weight(lbs): ?Blood Pressure(mmHg): 142/82 ?Body Mass Index(BMI): ?Temperature(??F): 98.4 ?Respiratory Rate(breaths/min): 18 ?Photos: [N/A:N/A] ?Wound Location: Right, Lateral, Posterior Foot N/A N/A ?Wounding Event: Gradually Appeared N/A N/A ?Primary Etiology: Diabetic Wound/Ulcer of the Lower N/A N/A ?Extremity ?Comorbid History: Type II Diabetes N/A N/A ?Date Acquired: 11/16/2020 N/A N/A ?Weeks of Treatment: 0 N/A N/A ?Wound Status: Open N/A N/A ?Wound Recurrence: No N/A N/A ?Measurements L x W x D (cm) 0.4x0.4x0.3 N/A N/A ?Area (cm?) : 0.126 N/A N/A ?Volume (cm?) : 0.038 N/A N/A ?Classification: Grade 2 N/A N/A ?Exudate Amount: Medium N/A N/A ?Exudate Type: Serosanguineous N/A N/A ?Exudate Color: red, brown N/A  N/A ?Granulation Amount: None Present (0%) N/A N/A ?Necrotic Amount: Large (67-100%) N/A N/A ?Necrotic Tissue: Eschar, Adherent Slough N/A N/A ?Epithelialization: None N/A N/A ?Treatment Notes ?Electronic Signature(s)

## 2021-06-27 DIAGNOSIS — L97512 Non-pressure chronic ulcer of other part of right foot with fat layer exposed: Secondary | ICD-10-CM | POA: Diagnosis not present

## 2021-07-02 ENCOUNTER — Encounter: Payer: BC Managed Care – PPO | Admitting: Physician Assistant

## 2021-07-02 DIAGNOSIS — L97512 Non-pressure chronic ulcer of other part of right foot with fat layer exposed: Secondary | ICD-10-CM | POA: Diagnosis not present

## 2021-07-02 DIAGNOSIS — F17210 Nicotine dependence, cigarettes, uncomplicated: Secondary | ICD-10-CM | POA: Diagnosis not present

## 2021-07-02 DIAGNOSIS — E1142 Type 2 diabetes mellitus with diabetic polyneuropathy: Secondary | ICD-10-CM | POA: Diagnosis not present

## 2021-07-02 DIAGNOSIS — L84 Corns and callosities: Secondary | ICD-10-CM | POA: Diagnosis not present

## 2021-07-02 DIAGNOSIS — E11621 Type 2 diabetes mellitus with foot ulcer: Secondary | ICD-10-CM | POA: Diagnosis not present

## 2021-07-02 DIAGNOSIS — Z7984 Long term (current) use of oral hypoglycemic drugs: Secondary | ICD-10-CM | POA: Diagnosis not present

## 2021-07-02 NOTE — Progress Notes (Signed)
Sally, MCCOLGAN B. ([id number]) ?Visit Report for 07/02/2021 ?Arrival Information Details ?Patient Name: Sally Graham, Sally Graham ?Date of Service: 07/02/2021 1:45 PM ?Medical Record Number: [id number] ?Patient Account Number: [id number] ?Date of Birth/Sex: [birth year] (51 y.o. F) ?Treating RN: Donnamarie Poag ?Primary Care Adhrit Krenz: Halina Maidens Other Clinician: ?Referring Tynleigh Birt: Halina Maidens ?Treating Elvie Maines/Extender: Jeri Cos ?Weeks in Treatment: 0 ?Visit Information History Since Last Visit ?Added or deleted any medications: No ?Patient Arrived: Ambulatory ?Had a fall or experienced change in No ?Arrival Time: 13:48 ?activities of daily living that may affect ?Accompanied By: self ?risk of falls: ?Transfer Assistance: None ?Hospitalized since last visit: No ?Patient Identification Verified: Yes ?Has Dressing in Place as Prescribed: Yes ?Secondary Verification Process Completed: Yes ?Has Footwear/Offloading in Place as Prescribed: Yes ?Patient Requires Transmission-Based Precautions: No ?Right: Other:surgical shoe ?Patient Has Alerts: Yes ?Pain Present Now: No ?Patient Alerts: TYPE 2 DIABETIC ?Electronic Signature(s) ?Signed: 07/02/2021 4:39:42 PM By: Donnamarie Poag ?Entered ByDonnamarie Poag on 07/02/2021 13:49:29 ?Stieg, Autry B. ([id number]) ?-------------------------------------------------------------------------------- ?Clinic Level of Care Assessment Details ?Patient Name: Sally, Graham ?Date of Service: 07/02/2021 1:45 PM ?Medical Record Number: [id number] ?Patient Account Number: [id number] ?Date of Birth/Sex: 1970-07-17 (51 y.o. F) ?Treating RN: Donnamarie Poag ?Primary Care Natascha Edmonds: Halina Maidens Other Clinician: ?Referring Eliceo Gladu: Halina Maidens ?Treating Fey Coghill/Extender: Jeri Cos ?Weeks in Treatment: 0 ?Clinic Level of Care Assessment Items ?TOOL 4 Quantity Score ?'[]'$  - Use when only an EandM is performed on FOLLOW-UP visit 0 ?ASSESSMENTS - Nursing Assessment / Reassessment ?'[]'$  - Reassessment of Co-morbidities  (includes updates in patient status) 0 ?'[]'$  - 0 ?Reassessment of Adherence to Treatment Plan ?ASSESSMENTS - Wound and Skin Assessment / Reassessment ?X - Simple Wound Assessment / Reassessment - one wound 1 5 ?'[]'$  - 0 ?Complex Wound Assessment / Reassessment - multiple wounds ?'[]'$  - 0 ?Dermatologic / Skin Assessment (not related to wound area) ?ASSESSMENTS - Focused Assessment ?'[]'$  - Circumferential Edema Measurements - multi extremities 0 ?'[]'$  - 0 ?Nutritional Assessment / Counseling / Intervention ?'[]'$  - 0 ?Lower Extremity Assessment (monofilament, tuning fork, pulses) ?'[]'$  - 0 ?Peripheral Arterial Disease Assessment (using hand held doppler) ?ASSESSMENTS - Ostomy and/or Continence Assessment and Care ?'[]'$  - Incontinence Assessment and Management 0 ?'[]'$  - 0 ?Ostomy Care Assessment and Management (repouching, etc.) ?PROCESS - Coordination of Care ?X - Simple Patient / Family Education for ongoing care 1 15 ?'[]'$  - 0 ?Complex (extensive) Patient / Family Education for ongoing care ?'[]'$  - 0 ?Staff obtains Consents, Records, Test Results / Process Orders ?'[]'$  - 0 ?Staff telephones HHA, Nursing Homes / Clarify orders / etc ?'[]'$  - 0 ?Routine Transfer to another Facility (non-emergent condition) ?'[]'$  - 0 ?Routine Hospital Admission (non-emergent condition) ?'[]'$  - 0 ?New Admissions / Biomedical engineer / Ordering NPWT, Apligraf, etc. ?'[]'$  - 0 ?Emergency Hospital Admission (emergent condition) ?X- 1 10 ?Simple Discharge Coordination ?'[]'$  - 0 ?Complex (extensive) Discharge Coordination ?PROCESS - Special Needs ?'[]'$  - Pediatric / Minor Patient Management 0 ?'[]'$  - 0 ?Isolation Patient Management ?'[]'$  - 0 ?Hearing / Language / Visual special needs ?'[]'$  - 0 ?Assessment of Community assistance (transportation, D/C planning, etc.) ?'[]'$  - 0 ?Additional assistance / Altered mentation ?'[]'$  - 0 ?Support Surface(s) Assessment (bed, cushion, seat, etc.) ?INTERVENTIONS - Wound Cleansing / Measurement ?KEYRY, IRACHETA B. ([id number]) ?X- 1 5 ?Simple Wound  Cleansing - one wound ?'[]'$  - 0 ?Complex Wound Cleansing - multiple wounds ?X- 1 5 ?Wound Imaging (photographs - any number of wounds) ?'[]'$  - 0 ?Wound Tracing (instead of photographs) ?X-  1 5 ?Simple Wound Measurement - one wound ?'[]'$  - 0 ?Complex Wound Measurement - multiple wounds ?INTERVENTIONS - Wound Dressings ?X - Small Wound Dressing one or multiple wounds 1 10 ?'[]'$  - 0 ?Medium Wound Dressing one or multiple wounds ?'[]'$  - 0 ?Large Wound Dressing one or multiple wounds ?X- 1 5 ?Application of Medications - topical ?'[]'$  - 0 ?Application of Medications - injection ?INTERVENTIONS - Miscellaneous ?'[]'$  - External ear exam 0 ?'[]'$  - 0 ?Specimen Collection (cultures, biopsies, blood, body fluids, etc.) ?'[]'$  - 0 ?Specimen(s) / Culture(s) sent or taken to Lab for analysis ?'[]'$  - 0 ?Patient Transfer (multiple staff / Civil Service fast streamer / Similar devices) ?'[]'$  - 0 ?Simple Staple / Suture removal (25 or less) ?'[]'$  - 0 ?Complex Staple / Suture removal (26 or more) ?'[]'$  - 0 ?Hypo / Hyperglycemic Management (close monitor of Blood Glucose) ?'[]'$  - 0 ?Ankle / Brachial Index (ABI) - do not check if billed separately ?X- 1 5 ?Vital Signs ?Has the patient been seen at the hospital within the last three years: Yes ?Total Score: 65 ?Level Of Care: New/Established - Level ?2 ?Electronic Signature(s) ?Signed: 07/02/2021 4:39:42 PM By: Donnamarie Poag ?Entered ByDonnamarie Poag on 07/02/2021 14:06:42 ?Recore, Tychelle B. ([id number]) ?-------------------------------------------------------------------------------- ?Encounter Discharge Information Details ?Patient Name: Sally, Graham ?Date of Service: 07/02/2021 1:45 PM ?Medical Record Number: [id number] ?Patient Account Number: [id number] ?Date of Birth/Sex: [birth year] (51 y.o. F) ?Treating RN: Donnamarie Poag ?Primary Care Albertus Chiarelli: Halina Maidens Other Clinician: ?Referring Jamille Fisher: Halina Maidens ?Treating Yichen Gilardi/Extender: Jeri Cos ?Weeks in Treatment: 0 ?Encounter Discharge Information Items ?Discharge Condition:  Stable ?Ambulatory Status: Ambulatory ?Discharge Destination: Home ?Transportation: Private Auto ?Accompanied By: self ?Schedule Follow-up Appointment: Yes ?Clinical Summary of Care: ?Electronic Signature(s) ?Signed: 07/02/2021 4:39:42 PM By: Donnamarie Poag ?Entered ByDonnamarie Poag on 07/02/2021 14:08:05 ?Coole, Ceylin B. ([id number]) ?-------------------------------------------------------------------------------- ?Lower Extremity Assessment Details ?Patient Name: OLAMAE, FERRARA ?Date of Service: 07/02/2021 1:45 PM ?Medical Record Number: [id number] ?Patient Account Number: [id number] ?Date of Birth/Sex: [birth year] (51 y.o. F) ?Treating RN: Donnamarie Poag ?Primary Care Leili Eskenazi: Halina Maidens Other Clinician: ?Referring Arseniy Toomey: Halina Maidens ?Treating Armond Cuthrell/Extender: Jeri Cos ?Weeks in Treatment: 0 ?Edema Assessment ?Assessed: [Left: No] [Right: Yes] ?Edema: [Left: N] [Right: o] ?Ankle ?Left: Right: ?Point of Measurement: 10 cm From Medial Instep 24.5 cm ?Vascular Assessment ?Pulses: ?Dorsalis Pedis ?Palpable: [Right:Yes] ?Electronic Signature(s) ?Signed: 07/02/2021 4:39:42 PM By: Donnamarie Poag ?Entered ByDonnamarie Poag on 07/02/2021 13:55:43 ?W[place]am, Maquita B. ([id number]) ?-------------------------------------------------------------------------------- ?Multi Wound Chart Details ?Patient Name: HELENA, SARDO ?Date of Service: 07/02/2021 1:45 PM ?Medical Record Number: [id number] ?Patient Account Number: [id number] ?Date of Birth/Sex: 1970/07/04 (51 y.o. F) ?Treating RN: Donnamarie Poag ?Primary Care Jailani Hogans: Halina Maidens Other Clinician: ?Referring Eli Adami: Halina Maidens ?Treating Robbi Scurlock/Extender: Jeri Cos ?Weeks in Treatment: 0 ?Vital Signs ?Height(in): 69 ?Pulse(bpm): 80 ?Weight(lbs): 200 ?Blood Pressure(mmHg): 148/86 ?Body Mass Index(BMI): 29.5 ?Temperature(??F): 98.2 ?Respiratory Rate(breaths/min): 16 ?Photos: [N/A:N/A] ?Wound Location: Right, Lateral, Posterior Foot N/A N/A ?Wounding Event: Gradually Appeared  N/A N/A ?Primary Etiology: Diabetic Wound/Ulcer of the Lower N/A N/A ?Extremity ?Comorbid History: Type II Diabetes N/A N/A ?Date Acquired: 11/16/2020 N/A N/A ?Weeks of Treatment: 0 N/A N/A ?Wound Status: Open N/

## 2021-07-02 NOTE — Progress Notes (Addendum)
ESMAE, DONATHAN B. ([id number]) ?Visit Report for 07/02/2021 ?Chief Complaint Document Details ?Patient Name: Sally Graham, Sally Graham ?Date of Service: 07/02/2021 1:45 PM ?Medical Record Number: [id number] ?Patient Account Number: [id number] ?Date of Birth/Sex: May 05, 1970 (51 y.o. F) ?Treating RN: Donnamarie Poag ?Primary Care Provider: Halina Maidens Other Clinician: ?Referring Provider: Halina Maidens ?Treating Provider/Extender: Jeri Cos ?Weeks in Treatment: 0 ?Information Obtained from: Patient ?Chief Complaint ?Right foot ulcer ?Electronic Signature(s) ?Signed: 07/02/2021 2:00:18 PM By: Worthy Keeler PA-C ?Entered By: Worthy Keeler on 07/02/2021 14:00:18 ?Beason, Nur B. ([id number]) ?-------------------------------------------------------------------------------- ?HPI Details ?Patient Name: Sally Graham, Sally Graham ?Date of Service: 07/02/2021 1:45 PM ?Medical Record Number: [id number] ?Patient Account Number: [id number] ?Date of Birth/Sex: Apr 09, 1970 (51 y.o. F) ?Treating RN: Donnamarie Poag ?Primary Care Provider: Halina Maidens Other Clinician: ?Referring Provider: Halina Maidens ?Treating Provider/Extender: Jeri Cos ?Weeks in Treatment: 0 ?History of Present Illness ?HPI Description: 06-26-2021 upon evaluation today patient presents for a wound over the plantar aspect of her right lateral foot and the fifth ?metatarsal area. She tells me that this is a longstanding issue which has been developing over quite some time. Although the first time she ever ?noted an open wound was somewhere around November 16, 2020. Nonetheless she has been fighting dealing with callus over this area for quite ?some time. She tells me currently that this overall she is gradually. She has tried multiple things to try to get rid of the callus that she just did not ?want it on there. She in fact used a callus remover it was only supposed down for 6 hours she tells me she left it on for 12 and that seems to have ?exacerbated things to some degree. The patient  also is a diabetic and really has not been rightly controlled due to not having insurance. She just ?got back on the metformin March 2023. Her most recent hemoglobin A1c on April 17, 2021 was 11.4. Of note she is a daily smoker she also ?was on Levaquin for 1 week starting on March 15 that is complete at this time. She did have an x-ray on April 18, 2021 which was negative for ?osteo-. Overall she really just feels like that the callus needs to be removed and what it was underneath treated. She is also unsure what to do to ?try to prevent the callus from continued to build. ?The patient does have a history of cigarette smoking which I will delay the conversation with her till the next visit as there was a lot going on ?today just dealing with the wounds themselves. Nonetheless this is definitely something that needs to be addressed. 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 ?07-02-2021 upon evaluation today patient actually appears to be completely healed which is absolutely amazing. She had a fairly significant wound ?with a ton of callus noted last week and subsequently we were able to clear this away. The wound in the interim from last week till this week ?healed in an excellent fashion this is completely closed with just some callus noted I am going to clear away the callus just to make sure that ?there is nothing hiding up underneath but honestly I really feel like she is doing excellent at this point. ?Electronic Signature(s) ?Signed: 07/02/2021 2:09:01 PM By: Worthy Keeler PA-C ?Entered By: Worthy Keeler on 07/02/2021 14:09:01 ?Sally Graham, Sally B. ([id number]) ?-------------------------------------------------------------------------------- ?Physical Exam Details ?Patient Name: Sally Graham, Sally Graham ?Date of Service: 07/02/2021  1:45 PM ?Medical Record Number: [id number] ?Patient Account Number: [id number] ?Date of Birth/Sex: 1970/10/31 (51 y.o.  F) ?Treating RN: Donnamarie Poag ?Primary Care Provider: Halina Maidens Other Clinician: ?Referring Provider: Halina Maidens ?Treating Provider/Extender: Jeri Cos ?Weeks in Treatment: 0 ?Constitutional ?Well-nourished and well-hydrated in no acute distress. ?Respiratory ?normal breathing without difficulty. ?Psychiatric ?this patient is able to make decisions and demonstrates good insight into disease process. Alert and Oriented x 3. pleasant and cooperative. ?Notes ?Upon inspection patient's wound bed had minimal amounts of callus noted I did actually perform some clear away the callus just to make sure ?there was nothing open underneath and everything seemed to be doing just fine. I saw no wound opening. Subsequently I Georgina Peer go ahead and ?have her use just a protective dressing we will make a referral to Dr. Amalia Hailey and we will see what he can do to help out in that regard. Nonetheless ?I really feel like she did excellent to heal as quickly as she did and on hope is that this will not be an ongoing issue for her she is needs to figure to ?keep this callus down. ?Electronic Signature(s) ?Signed: 07/02/2021 2:09:35 PM By: Worthy Keeler PA-C ?Entered By: Worthy Keeler on 07/02/2021 14:09:35 ?Sally Graham, Sally B. ([id number]) ?-------------------------------------------------------------------------------- ?Physician Orders Details ?Patient Name: Sally Graham, Sally Graham ?Date of Service: 07/02/2021 1:45 PM ?Medical Record Number: [id number] ?Patient Account Number: [id number] ?Date of Birth/Sex: [birth year] (51 y.o. 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 Amalia Hailey at [place]; Phone: [phone] ?Wound Treatment ?Electronic Signature(s) ?Signed: 07/02/2021 4:39:42 PM By: Donnamarie Poag ?Signed: 07/02/2021 4:40:44 PM By: Worthy Keeler PA-C ?Entered ByDonnamarie Poag on 07/02/2021 14:05:24 ?Sally Graham, Sally B. ([id number]) ?-------------------------------------------------------------------------------- ?Problem List Details ?Patient Name: Sally Graham, KOSLOSKY ?Date of Service: 07/02/2021 1:45 PM ?Medical Record Number: [id number] ?Patient Account Number: [id number] ?Date of Birth/Sex: 1970/12/11 (51 y.o. F) ?Treating RN: Donnamarie Poag ?Primary Care Provider: Halina Maidens Other Clinician: ?Referring Provider: Halina Maidens ?Treating Provider/Extender: Jeri Cos ?Weeks in Treatment: 0 ?Active Problems ?ICD-10 ?Encounter ?Code Description Active Date MDM ?Diagnosis ?E11.621 Type 2 diabetes mellitus with foot ulcer 06/26/2021 No Yes ?U88.280 Non-pressure chronic ulcer of other part of right foot with fat layer 06/26/2021 No Yes ?exposed ?E11.43 Type 2 diabetes mellitus with diabetic autonomic (poly)neuropathy 06/26/2021 No Yes ?F17.218 Nicotine dependence, cigarettes, with other nicotine-induced disorders 06/26/2021 No Yes ?L84 Corns and callosities 06/26/2021 No Yes ?Inactive Problems ?Resolved Problems ?Electronic Signature(s) ?Signed: 07/02/2021 2:00:13 PM By: Worthy Keeler PA-C ?Entered By: Worthy Keeler on 07/02/2021 14:00:13 ?Kost, Estefana B.  ([id number]) ?-------------------------------------------------------------------------------- ?Progress Note Details ?Patient Name: ODEAL, WELDEN ?Date of Service: 07/02/2021 1:45 PM ?Medical Record Number: [id number] ?Patient Account Number: [id number] ?Date of Birth/Sex: [birth year]

## 2021-07-10 ENCOUNTER — Ambulatory Visit: Payer: BC Managed Care – PPO | Admitting: Physician Assistant

## 2021-07-10 ENCOUNTER — Other Ambulatory Visit: Payer: Self-pay | Admitting: Internal Medicine

## 2021-07-10 ENCOUNTER — Ambulatory Visit: Payer: BC Managed Care – PPO | Admitting: Podiatry

## 2021-07-10 DIAGNOSIS — L989 Disorder of the skin and subcutaneous tissue, unspecified: Secondary | ICD-10-CM

## 2021-07-10 DIAGNOSIS — E0843 Diabetes mellitus due to underlying condition with diabetic autonomic (poly)neuropathy: Secondary | ICD-10-CM | POA: Diagnosis not present

## 2021-07-10 DIAGNOSIS — E118 Type 2 diabetes mellitus with unspecified complications: Secondary | ICD-10-CM

## 2021-07-10 NOTE — Progress Notes (Signed)
? ?  Subjective: ?51 y.o. female presenting to the office today as a new patient who was recently diagnosed with diabetes mellitus January 2023.  She was actually a referral from the wound care center.  She recently had a wound developed to the plantar aspect of the fifth MTP joint right foot and she was managed at the wound care center and it healed uneventfully.  She now developed symptomatic calluses to the areas.  She presents today to get established and for routine diabetic foot exam as well as preventative measures to prevent calluses or wounds from developing.  Last A1c 04/17/2021 11.4 ? ? ?Past Medical History:  ?Diagnosis Date  ? Diabetes mellitus without complication ([place])   ? ?Past Surgical History:  ?Procedure Laterality Date  ? CESAREAN SECTION  2000  ? ?No Known Allergies ? ? ?Objective:  ?Physical Exam ?General: Alert and oriented x3 in no acute distress ? ?Dermatology: Hyperkeratotic lesion(s) present on the plantar aspect of the fifth MTP bilateral. Pain on palpation with a central nucleated core noted. Skin is warm, dry and supple bilateral lower extremities. Negative for open lesions or macerations. ? ?Vascular: Palpable pedal pulses bilaterally. No edema or erythema noted. Capillary refill within normal limits. ? ?Neurological: Epicritic and protective threshold diminished especially in the forefoot bilaterally.  ? ?Musculoskeletal Exam: High arches noted which causes excessive pressure to the balls of the feet during ambulation ? ?Assessment: ?1.  Diabetes mellitus with peripheral polyneuropathy ?2. PMHx ulcer plantar aspect of the fifth MTP right foot ?3.  Symptomatic preulcerative callus lesions bilateral feet ? ? ?Plan of Care:  ?1. Patient evaluated.  Today we discussed preventative measures and good foot hygiene especially for patients with diabetes. ?2. Excisional debridement of keratoic lesion(s) using a chisel blade was performed without incident.  ?3.  Advised the patient against going  barefoot.  Recommend good supportive shoes and sneakers at all times even in the house ?4.  Recommend daily foot lotion. AmLactin recommended. ?5.  Appointment with Pedorthist for custom molded orthotics/diabetic shoes and insoles to support the arches and offload pressure from the forefoot ?6.  Return to clinic as needed for routine foot care or callus debridement ? ?Edrick Kins, DPM ?[place] ? ?Dr. Edrick Kins, DPM  ?  ?[facility] [place]                                        ?[place], [place] [address]                ?Office [phone]  ?Fax [phone] ? ? ? ? ?

## 2021-07-11 ENCOUNTER — Encounter: Payer: Self-pay | Admitting: Internal Medicine

## 2021-07-11 ENCOUNTER — Ambulatory Visit: Payer: BC Managed Care – PPO | Admitting: Internal Medicine

## 2021-07-11 VITALS — BP 130/82 | HR 76 | Ht 69.0 in | Wt 199.6 lb

## 2021-07-11 DIAGNOSIS — R809 Proteinuria, unspecified: Secondary | ICD-10-CM | POA: Diagnosis not present

## 2021-07-11 DIAGNOSIS — E1129 Type 2 diabetes mellitus with other diabetic kidney complication: Secondary | ICD-10-CM | POA: Diagnosis not present

## 2021-07-11 DIAGNOSIS — E118 Type 2 diabetes mellitus with unspecified complications: Secondary | ICD-10-CM

## 2021-07-11 MED ORDER — METFORMIN HCL ER 500 MG PO TB24: 1500.0000 mg | ORAL_TABLET | Freq: Every day | ORAL | 1 refills | Status: DC

## 2021-07-11 MED ORDER — LISINOPRIL 5 MG PO TABS: 5.0000 mg | ORAL_TABLET | Freq: Every day | ORAL | 1 refills | Status: DC

## 2021-07-11 NOTE — Telephone Encounter (Signed)
Rx 05/30/21 #180- 3 month supply- too soon ?Requested Prescriptions  ?Pending Prescriptions Disp Refills  ?? metFORMIN (GLUCOPHAGE-XR) 500 MG 24 hr tablet [Pharmacy Med Name: METFORMIN HCL ER 500 MG TABLET] 60 tablet 2  ?  Sig: TAKE 1 TABLET BY MOUTH TWICE A DAY  ?  ? Endocrinology:  Diabetes - Biguanides Failed - 07/10/2021  2:40 AM  ?  ?  Failed - HBA1C is between 0 and 7.9 and within 180 days  ?  Hemoglobin A1C  ?Date Value Ref Range Status  ?04/17/2021 11.4 (A) 4.0 - 5.6 % Final  ?   ?  ?  Failed - B12 Level in normal range and within 720 days  ?  No results found for: VITAMINB12   ?  ?  Passed - Cr in normal range and within 360 days  ?  Creatinine, Ser  ?Date Value Ref Range Status  ?04/17/2021 0.82 0.57 - 1.00 mg/dL Final  ?   ?  ?  Passed - eGFR in normal range and within 360 days  ?  eGFR  ?Date Value Ref Range Status  ?04/17/2021 88 >59 mL/min/1.73 Final  ?   ?  ?  Passed - Valid encounter within last 6 months  ?  Recent Outpatient Visits   ?      ? 1 month ago Type II diabetes mellitus with complication ([place])  ? [facility] Glean Hess, MD  ? 2 months ago Type II diabetes mellitus with complication [facility])  ? [facility] Glean Hess, MD  ?  ?  ?Future Appointments   ?        ? Today Glean Hess, MD [facility], [place]  ? In 4 months Glean Hess, MD [facility]  ?  ? ?  ?  ?  Passed - CBC within normal limits and completed in the last 12 months  ?  WBC  ?Date Value Ref Range Status  ?04/17/2021 10.3 3.4 - 10.8 x10E3/uL Final  ? ?RBC  ?Date Value Ref Range Status  ?04/17/2021 5.04 3.77 - 5.28 x10E6/uL Final  ? ?Hemoglobin  ?Date Value Ref Range Status  ?04/17/2021 15.3 11.1 - 15.9 g/dL Final  ? ?Hematocrit  ?Date Value Ref Range Status  ?04/17/2021 45.9 34.0 - 46.6 % Final  ? ?MCHC  ?Date Value Ref Range Status  ?04/17/2021 33.3 31.5 - 35.7 g/dL Final  ? ?MCH  ?Date Value Ref Range Status  ?04/17/2021 30.4 26.6 - 33.0 pg Final  ? ?MCV  ?Date  Value Ref Range Status  ?04/17/2021 91 79 - 97 fL Final  ? ?No results found for: PLTCOUNTKUC, LABPLAT, [place] ?RDW  ?Date Value Ref Range Status  ?04/17/2021 12.9 11.7 - 15.4 % Final  ? ?  ?  ?  ? ?

## 2021-07-11 NOTE — Progress Notes (Signed)
? ? ?Date:  07/11/2021  ? ?Name:  Sally Graham   DOB:  Aug 16, 1970   MRN:  [id number] ? ? ?Chief Complaint: Hypertension and Diabetes ? ?Diabetes ?She presents for her follow-up diabetic visit. She has type 2 diabetes mellitus. Her disease course has been improving (uncontrolled). Pertinent negatives for hypoglycemia include no headaches or tremors. Pertinent negatives for diabetes include no chest pain, no fatigue, no polydipsia and no polyuria. Current diabetic treatment includes oral agent (monotherapy) (metformin). Her weight is stable. She is following a generally healthy diet. Her breakfast blood glucose is taken between 6-7 am. Her breakfast blood glucose range is generally 180-200 mg/dl. (This is down from the 300's) An ACE inhibitor/angiotensin II receptor blocker is being taken. She sees a podiatrist (foot ulcer has healed; getting inserts for foot protection). ? ?Lab Results  ?Component Value Date  ? NA 136 04/17/2021  ? K 4.7 04/17/2021  ? CO2 22 04/17/2021  ? GLUCOSE 315 (H) 04/17/2021  ? BUN 13 04/17/2021  ? CREATININE 0.82 04/17/2021  ? CALCIUM 9.9 04/17/2021  ? EGFR 88 04/17/2021  ? ?No results found for: CHOL, HDL, LDLCALC, LDLDIRECT, TRIG, CHOLHDL ?No results found for: TSH ?Lab Results  ?Component Value Date  ? HGBA1C 11.4 (A) 04/17/2021  ? ?Lab Results  ?Component Value Date  ? WBC 10.3 04/17/2021  ? HGB 15.3 04/17/2021  ? HCT 45.9 04/17/2021  ? MCV 91 04/17/2021  ? PLT 338 04/17/2021  ? ?Lab Results  ?Component Value Date  ? ALT 6 04/17/2021  ? AST 13 04/17/2021  ? ALKPHOS 111 04/17/2021  ? BILITOT 0.9 04/17/2021  ? ?No results found for: 25OHVITD2, [place], VD25OH  ? ?Review of Systems  ?Constitutional:  Negative for appetite change, fatigue, fever and unexpected weight change.  ?HENT:  Negative for tinnitus and trouble swallowing.   ?Eyes:  Negative for visual disturbance.  ?Respiratory:  Negative for cough, chest tightness and shortness of breath.   ?Cardiovascular:  Negative for chest pain,  palpitations and leg swelling.  ?Gastrointestinal:  Negative for abdominal pain.  ?Endocrine: Negative for polydipsia and polyuria.  ?Genitourinary:  Negative for dysuria and hematuria.  ?Musculoskeletal:  Negative for arthralgias.  ?Neurological:  Negative for tremors, numbness and headaches.  ?Psychiatric/Behavioral:  Negative for dysphoric mood.   ? ?Patient Active Problem List  ? Diagnosis Date Noted  ? Microalbuminuria due to type 2 diabetes mellitus ([place]) 07/11/2021  ? Type II diabetes mellitus with complication ([place]) 09/98/3382  ? Diabetic foot ulcer ([place]) 04/17/2021  ? ? ?No Known Allergies ? ?Past Surgical History:  ?Procedure Laterality Date  ? CESAREAN SECTION  2000  ? ? ?Social History  ? ?Tobacco Use  ? Smoking status: Every Day  ?  Packs/day: 0.50  ?  Years: 30.00  ?  Pack years: 15.00  ?  Types: Cigarettes  ? Smokeless tobacco: Never  ?Vaping Use  ? Vaping Use: Never used  ?Substance Use Topics  ? Alcohol use: Yes  ?  Alcohol/week: 3.0 - 4.0 standard drinks  ?  Types: 3 - 4 Glasses of wine per week  ?  Comment: occassionally  ? Drug use: Never  ? ? ? ?Medication list has been reviewed and updated. ? ?Current Meds  ?Medication Sig  ? ACCU-CHEK GUIDE test strip USE TO CHECK BLOOD SUGAR UP TO 4 TIMES DAILY  ? Accu-Chek Softclix Lancets lancets TEST UP TO FOUR TIMES DAILY  ? CINNAMON PO Take by mouth.  ? lisinopril (ZESTRIL) 5 MG tablet  Take 1 tablet (5 mg total) by mouth daily.  ? metFORMIN (GLUCOPHAGE XR) 500 MG 24 hr tablet Take 1 tablet (500 mg total) by mouth 2 (two) times daily.  ? Multiple Vitamins-Minerals (MULTIVIT/MULTIMINERAL ADULT PO) Take by mouth.  ? Omega-3 Fatty Acids (FISH OIL) 1000 MG CAPS Take by mouth.  ? ? ? ?  07/11/2021  ?  2:39 PM 05/30/2021  ?  1:50 PM 04/17/2021  ?  3:21 PM  ?GAD 7 : Generalized Anxiety Score  ?Nervous, Anxious, on Edge 0 0 0  ?Control/stop worrying 0 0 0  ?Worry too much - different things 0 0 0  ?Trouble relaxing 0 0 0  ?Restless 0 0 0  ?Easily annoyed or  irritable 0 0 0  ?Afraid - awful might happen 0 0 0  ?Total GAD 7 Score 0 0 0  ?Anxiety Difficulty Not difficult at all Not difficult at all   ? ? ? ?  07/11/2021  ?  2:39 PM  ?Depression screen PHQ 2/9  ?Decreased Interest 0  ?Down, Depressed, Hopeless 0  ?PHQ - 2 Score 0  ?Altered sleeping 0  ?Tired, decreased energy 0  ?Change in appetite 0  ?Feeling bad or failure about yourself  0  ?Trouble concentrating 0  ?Moving slowly or fidgety/restless 0  ?Suicidal thoughts 0  ?PHQ-9 Score 0  ?Difficult doing work/chores Not difficult at all  ? ? ?BP Readings from Last 3 Encounters:  ?07/11/21 130/82  ?05/30/21 136/88  ?04/17/21 120/78  ? ? ?Physical Exam ?Vitals and nursing note reviewed.  ?Constitutional:   ?   General: She is not in acute distress. ?   Appearance: Normal appearance. She is well-developed.  ?HENT:  ?   Head: Normocephalic and atraumatic.  ?Cardiovascular:  ?   Rate and Rhythm: Normal rate and regular rhythm.  ?   Pulses: Normal pulses.  ?   Heart sounds: No murmur heard. ?  No friction rub.  ?Pulmonary:  ?   Effort: Pulmonary effort is normal. No respiratory distress.  ?   Breath sounds: No wheezing or rhonchi.  ?Musculoskeletal:  ?   Cervical back: Normal range of motion.  ?   Right lower leg: No edema.  ?   Left lower leg: No edema.  ?Lymphadenopathy:  ?   Cervical: No cervical adenopathy.  ?Skin: ?   General: Skin is warm and dry.  ?   Capillary Refill: Capillary refill takes less than 2 seconds.  ?   Findings: No rash.  ?Neurological:  ?   General: No focal deficit present.  ?   Mental Status: She is alert and oriented to person, place, and time.  ?Psychiatric:     ?   Mood and Affect: Mood normal.     ?   Behavior: Behavior normal.  ? ? ?Wt Readings from Last 3 Encounters:  ?07/11/21 199 lb 9.6 oz (90.5 kg)  ?05/30/21 198 lb 9.6 oz (90.1 kg)  ?04/17/21 200 lb (90.7 kg)  ? ? ?BP 130/82   Pulse 76   Ht 5' 9"  (1.753 m)   Wt 199 lb 9.6 oz (90.5 kg)   SpO2 99%   BMI 29.48 kg/m?  ? ?Assessment and  Plan: ?1. Type II diabetes mellitus with complication ([place]) ?BS are improving on metformin; will increase to 3 per day. ?Schedule DM eye exam. ?- Comprehensive metabolic panel ?- Hemoglobin A1c ?- metFORMIN (GLUCOPHAGE XR) 500 MG 24 hr tablet; Take 3 tablets (1,500 mg total) by mouth daily  before supper.  Dispense: 270 tablet; Refill: 1 ? ?2. Microalbuminuria due to type 2 diabetes mellitus ([place]) ?Continue ACEI. ?- lisinopril (ZESTRIL) 5 MG tablet; Take 1 tablet (5 mg total) by mouth daily.  Dispense: 90 tablet; Refill: 1 ? ? ?Partially dictated using Editor, commissioning. Any errors are unintentional. ? ?Halina Maidens, MD ?[facility] ?[facility] ? ?07/11/2021 ? ? ? ? ?

## 2021-07-12 LAB — COMPREHENSIVE METABOLIC PANEL
ALT: 9 IU/L (ref 0–32)
AST: 13 IU/L (ref 0–40)
Albumin/Globulin Ratio: 1.5 (ref 1.2–2.2)
Albumin: 4.4 g/dL (ref 3.8–4.8)
Alkaline Phosphatase: 91 IU/L (ref 44–121)
BUN/Creatinine Ratio: 13 (ref 9–23)
BUN: 10 mg/dL (ref 6–24)
Bilirubin Total: 0.6 mg/dL (ref 0.0–1.2)
CO2: 24 mmol/L (ref 20–29)
Calcium: 10 mg/dL (ref 8.7–10.2)
Chloride: 100 mmol/L (ref 96–106)
Creatinine, Ser: 0.78 mg/dL (ref 0.57–1.00)
Globulin, Total: 3 g/dL (ref 1.5–4.5)
Glucose: 250 mg/dL — ABNORMAL HIGH (ref 70–99)
Potassium: 4.5 mmol/L (ref 3.5–5.2)
Sodium: 140 mmol/L (ref 134–144)
Total Protein: 7.4 g/dL (ref 6.0–8.5)
eGFR: 94 mL/min/{1.73_m2} (ref >59)

## 2021-07-12 LAB — HEMOGLOBIN A1C
Est. average glucose Bld gHb Est-mCnc: 217 mg/dL
Hgb A1c MFr Bld: 9.2 % — ABNORMAL HIGH (ref 4.8–5.6)

## 2021-07-20 ENCOUNTER — Ambulatory Visit (INDEPENDENT_AMBULATORY_CARE_PROVIDER_SITE_OTHER): Payer: BC Managed Care – PPO

## 2021-07-20 DIAGNOSIS — M722 Plantar fascial fibromatosis: Secondary | ICD-10-CM | POA: Diagnosis not present

## 2021-07-20 NOTE — Progress Notes (Signed)
SITUATION ?Reason for Consult: Evaluation for Bilateral Custom Foot Orthoses ?Patient / Caregiver Report: Patient is ready for foot orthotics ? ?OBJECTIVE DATA: ?Patient History / Diagnosis:  ?  ICD-10-CM   ?1. Plantar fasciitis, bilateral  M72.2   ?  ? ? ?Current or Previous Devices:   None and no history ? ?Foot Examination: ?Skin presentation:   Intact ?Ulcers & Callousing:   Historical ?Toe / Foot Deformities:  None ?Weight Bearing Presentation:  Rectus ?Sensation:    Intact ? ?Shoe Size:    29M ? ?ORTHOTIC RECOMMENDATION ?Recommended Device: 1x pair of custom functional foot orthotics ? ?GOALS OF ORTHOSES ?- Reduce Pain ?- Prevent Foot Deformity ?- Prevent Progression of Further Foot Deformity ?- Relieve Pressure ?- Improve the Overall Biomechanical Function of the Foot and Lower Extremity. ? ?ACTIONS PERFORMED ?Potential out of pocket cost was communicated to patient. Patient understood and consent to casting. Patient was casted for Foot Orthoses via crush box. Procedure was explained and patient tolerated procedure well. Casts were shipped to central fabrication. All questions were answered and concerns addressed. ? ?PLAN ?Patient is to be called for fitting when devices are ready.  ? ? ?

## 2021-08-31 ENCOUNTER — Other Ambulatory Visit: Payer: BC Managed Care – PPO

## 2021-09-03 ENCOUNTER — Ambulatory Visit: Payer: BC Managed Care – PPO

## 2021-09-03 DIAGNOSIS — E0843 Diabetes mellitus due to underlying condition with diabetic autonomic (poly)neuropathy: Secondary | ICD-10-CM

## 2021-09-03 DIAGNOSIS — M722 Plantar fascial fibromatosis: Secondary | ICD-10-CM

## 2021-09-03 NOTE — Progress Notes (Signed)
SITUATION: Reason for Visit: Fitting and Delivery of Custom Fabricated Foot Orthoses Patient Report: Patient reports comfort and is satisfied with device.  OBJECTIVE DATA: Patient History / Diagnosis:     ICD-10-CM   1. 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 Plantar fasciitis, bilateral  M72.2       Provided Device:  Custom Functional Foot Orthotics     [facility]: [id number]  GOAL OF ORTHOSIS - Improve gait - Decrease energy expenditure - Improve Balance - Provide Triplanar stability of foot complex - Facilitate motion  ACTIONS PERFORMED Patient was fit with foot orthotics trimmed to shoe last. Patient tolerated fittign procedure.   Patient was provided with verbal and written instruction and demonstration regarding donning, doffing, wear, care, proper fit, function, purpose, cleaning, and use of the orthosis and in all related precautions and risks and benefits regarding the orthosis.  Patient was also provided with verbal instruction regarding how to report any failures or malfunctions of the orthosis and necessary follow up care. Patient was also instructed to contact our office regarding any change in status that may affect the function of the orthosis.  Patient demonstrated independence with proper donning, doffing, and fit and verbalized understanding of all instructions.  PLAN: Patient is to follow up in one week or as necessary (PRN). All questions were answered and concerns addressed. Plan of care was discussed with and agreed upon by the patient.

## 2021-09-05 ENCOUNTER — Encounter: Payer: Self-pay | Admitting: Internal Medicine

## 2021-09-05 LAB — HM DIABETES EYE EXAM

## 2021-09-06 ENCOUNTER — Encounter: Payer: Self-pay | Admitting: Internal Medicine

## 2021-11-28 ENCOUNTER — Encounter: Payer: BC Managed Care – PPO | Admitting: Internal Medicine

## 2021-12-10 ENCOUNTER — Other Ambulatory Visit (HOSPITAL_COMMUNITY)
Admission: RE | Admit: 2021-12-10 | Discharge: 2021-12-10 | Disposition: A | Discharge status: Home / Self Care | Payer: BC Managed Care – PPO | Source: Ambulatory Visit | Attending: Internal Medicine | Admitting: Internal Medicine

## 2021-12-10 ENCOUNTER — Ambulatory Visit (INDEPENDENT_AMBULATORY_CARE_PROVIDER_SITE_OTHER): Payer: BC Managed Care – PPO | Admitting: Internal Medicine

## 2021-12-10 ENCOUNTER — Encounter: Payer: Self-pay | Admitting: Internal Medicine

## 2021-12-10 VITALS — BP 118/70 | HR 87 | Ht 69.0 in | Wt 193.6 lb

## 2021-12-10 DIAGNOSIS — Z124 Encounter for screening for malignant neoplasm of cervix: Secondary | ICD-10-CM | POA: Diagnosis not present

## 2021-12-10 DIAGNOSIS — Z13220 Encounter for screening for lipoid disorders: Secondary | ICD-10-CM | POA: Diagnosis not present

## 2021-12-10 DIAGNOSIS — Z1231 Encounter for screening mammogram for malignant neoplasm of breast: Secondary | ICD-10-CM

## 2021-12-10 DIAGNOSIS — Z1211 Encounter for screening for malignant neoplasm of colon: Secondary | ICD-10-CM | POA: Diagnosis not present

## 2021-12-10 DIAGNOSIS — E118 Type 2 diabetes mellitus with unspecified complications: Secondary | ICD-10-CM | POA: Diagnosis not present

## 2021-12-10 DIAGNOSIS — Z23 Encounter for immunization: Secondary | ICD-10-CM

## 2021-12-10 DIAGNOSIS — L98499 Non-pressure chronic ulcer of skin of other sites with unspecified severity: Secondary | ICD-10-CM | POA: Insufficient documentation

## 2021-12-10 DIAGNOSIS — Z0000 Encounter for general adult medical examination without abnormal findings: Secondary | ICD-10-CM | POA: Diagnosis not present

## 2021-12-10 NOTE — Progress Notes (Signed)
Date:  12/10/2021   Name:  Sally Graham   DOB:  [birth year]   MRN:  [id number]   Chief Complaint: Annual Exam (Breast Exam, PAP. ) Sally Graham is a 51 y.o. female who presents today for her Complete Annual Exam. She feels well. She reports exercising. She reports she is sleeping well. Breast complaints - none.  Mammogram: none DEXA: none Pap smear: due Colonoscopy: none  Health Maintenance Due  Topic Date Due   HIV Screening  Never done   TETANUS/TDAP  Never done   PAP SMEAR-Modifier  Never done   COLONOSCOPY (Pts 45-35yr Insurance coverage will need to be confirmed)  Never done   COVID-19 Vaccine (3 - Pfizer series) 09/18/2019    Immunization History  Administered Date(s) Administered   PFIZER(Purple Top)SARS-COV-2 Vaccination 07/02/2019, 07/24/2019    Diabetes She presents for her follow-up diabetic visit. She has type 2 diabetes mellitus. Pertinent negatives for hypoglycemia include no dizziness, headaches, nervousness/anxiousness or tremors. Pertinent negatives for diabetes include no chest pain, no fatigue, no polydipsia and no polyuria. Symptoms are improving. Current diabetic treatment includes oral agent (monotherapy). She is compliant with treatment all of the time.   Callus - this was healed by wound clinic and she has been using the inserts as instructed.  However, it is growing again and causing discomfort.   Lab Results  Component Value Date   NA 140 07/11/2021   K 4.5 07/11/2021   CO2 24 07/11/2021   GLUCOSE 250 (H) 07/11/2021   BUN 10 07/11/2021   CREATININE 0.78 07/11/2021   CALCIUM 10.0 07/11/2021   EGFR 94 07/11/2021   No results found for: "CHOL", "HDL", "LDLCALC", "LDLDIRECT", "TRIG", "CHOLHDL" No results found for: "TSH" Lab Results  Component Value Date   HGBA1C 9.2 (H) 07/11/2021   Lab Results  Component Value Date   WBC 10.3 04/17/2021   HGB 15.3 04/17/2021   HCT 45.9 04/17/2021   MCV 91 04/17/2021   PLT 338 04/17/2021   Lab  Results  Component Value Date   ALT 9 07/11/2021   AST 13 07/11/2021   ALKPHOS 91 07/11/2021   BILITOT 0.6 07/11/2021   No results found for: "25OHVITD2", "25OHVITD3", "VD25OH"   Review of Systems  Constitutional:  Negative for chills, fatigue and fever.  HENT:  Negative for congestion, hearing loss, tinnitus, trouble swallowing and voice change.   Eyes:  Negative for visual disturbance.  Respiratory:  Negative for cough, chest tightness, shortness of breath and wheezing.   Cardiovascular:  Negative for chest pain, palpitations and leg swelling.  Gastrointestinal:  Negative for abdominal pain, constipation, diarrhea and vomiting.  Endocrine: Negative for polydipsia and polyuria.  Genitourinary:  Negative for dysuria, frequency, genital sores, vaginal bleeding and vaginal discharge.       Decreased libido  Musculoskeletal:  Positive for gait problem. Negative for arthralgias and joint swelling.  Skin:  Positive for wound (callus on lateral right foot). Negative for color change and rash.  Neurological:  Negative for dizziness, tremors, light-headedness and headaches.  Hematological:  Negative for adenopathy. Does not bruise/bleed easily.  Psychiatric/Behavioral:  Negative for dysphoric mood and sleep disturbance. The patient is not nervous/anxious.     Patient Active Problem List   Diagnosis Date Noted   Microalbuminuria due to type 2 diabetes mellitus ([place] 07/11/2021   Type II diabetes mellitus with complication ([place] 038/17/7116   No Known Allergies  Past Surgical History:  Procedure Laterality Date   CESAREAN SECTION  2000    Social History   Tobacco Use   Smoking status: Every Day    Packs/day: 0.50    Years: 15.00    Total pack years: 7.50    Types: Cigarettes   Smokeless tobacco: Never  Vaping Use   Vaping Use: Never used  Substance Use Topics   Alcohol use: Yes    Alcohol/week: 3.0 - 4.0 standard drinks of alcohol    Types: 3 - 4 Glasses of wine per week     Comment: occassionally   Drug use: Never     Medication list has been reviewed and updated.  Current Meds  Medication Sig   ACCU-CHEK GUIDE test strip USE TO CHECK BLOOD SUGAR UP TO 4 TIMES DAILY   Accu-Chek Softclix Lancets lancets TEST UP TO FOUR TIMES DAILY   CINNAMON PO Take by mouth.   lisinopril (ZESTRIL) 5 MG tablet Take 1 tablet (5 mg total) by mouth daily.   metFORMIN (GLUCOPHAGE XR) 500 MG 24 hr tablet Take 3 tablets (1,500 mg total) by mouth daily before supper.   Multiple Vitamins-Minerals (MULTIVIT/MULTIMINERAL ADULT PO) Take by mouth.   Omega-3 Fatty Acids (FISH OIL) 1000 MG CAPS Take by mouth.       12/10/2021   11:04 AM 07/11/2021    2:39 PM 05/30/2021    1:50 PM 04/17/2021    3:21 PM  GAD 7 : Generalized Anxiety Score  Nervous, Anxious, on Edge 0 0 0 0  Control/stop worrying 0 0 0 0  Worry too much - different things 0 0 0 0  Trouble relaxing 0 0 0 0  Restless 0 0 0 0  Easily annoyed or irritable 0 0 0 0  Afraid - awful might happen 0 0 0 0  Total GAD 7 Score 0 0 0 0  Anxiety Difficulty Not difficult at all Not difficult at all Not difficult at all        12/10/2021   11:04 AM 07/11/2021    2:39 PM 05/30/2021    1:50 PM  Depression screen PHQ 2/9  Decreased Interest 0 0 0  Down, Depressed, Hopeless 0 0 0  PHQ - 2 Score 0 0 0  Altered sleeping 0 0 0  Tired, decreased energy 0 0 0  Change in appetite 0 0 0  Feeling bad or failure about yourself  0 0 0  Trouble concentrating 0 0 0  Moving slowly or fidgety/restless 0 0 0  Suicidal thoughts 0 0 0  PHQ-9 Score 0 0 0  Difficult doing work/chores Not difficult at all Not difficult at all Not difficult at all    BP Readings from Last 3 Encounters:  12/10/21 118/70  07/11/21 130/82  05/30/21 136/88    Physical Exam Vitals and nursing note reviewed.  Constitutional:      General: She is not in acute distress.    Appearance: Normal appearance. She is well-developed.  HENT:     Head: Normocephalic  and atraumatic.     Right Ear: Tympanic membrane and ear canal normal.     Left Ear: Tympanic membrane and ear canal normal.     Nose:     Right Sinus: No maxillary sinus tenderness.     Left Sinus: No maxillary sinus tenderness.  Eyes:     General: No scleral icterus.       Right eye: No discharge.        Left eye: No discharge.     Conjunctiva/sclera: Conjunctivae normal.  Neck:  Thyroid: No thyromegaly.     Vascular: No carotid bruit.  Cardiovascular:     Rate and Rhythm: Normal rate and regular rhythm.     Pulses: Normal pulses.     Heart sounds: Normal heart sounds.  Pulmonary:     Effort: Pulmonary effort is normal. No respiratory distress.     Breath sounds: No wheezing.  Chest:  Breasts:    Right: No mass, nipple discharge, skin change or tenderness.     Left: No mass, nipple discharge, skin change or tenderness.  Abdominal:     General: Bowel sounds are normal.     Palpations: Abdomen is soft.     Tenderness: There is no abdominal tenderness.  Genitourinary:    Labia:        Right: No tenderness, lesion or injury.        Left: No tenderness, lesion or injury.      Vagina: Normal.     Cervix: Normal.     Uterus: Normal.      Adnexa: Right adnexa normal and left adnexa normal.     Comments: Pap obtained Musculoskeletal:     Cervical back: Normal range of motion. No erythema.     Right lower leg: No edema.     Left lower leg: No edema.       Feet:  Feet:     Comments: Thick smooth callus with some old blood under the lesion Lymphadenopathy:     Cervical: No cervical adenopathy.  Skin:    General: Skin is warm and dry.     Findings: No rash.  Neurological:     Mental Status: She is alert and oriented to person, place, and time.     Cranial Nerves: No cranial nerve deficit.     Sensory: No sensory deficit.     Deep Tendon Reflexes: Reflexes are normal and symmetric.  Psychiatric:        Attention and Perception: Attention normal.        Mood and  Affect: Mood normal.     Wt Readings from Last 3 Encounters:  12/10/21 193 lb 9.6 oz (87.8 kg)  07/11/21 199 lb 9.6 oz (90.5 kg)  05/30/21 198 lb 9.6 oz (90.1 kg)    BP 118/70   Pulse 87   Ht 5' 9"  (1.753 m)   Wt 193 lb 9.6 oz (87.8 kg)   SpO2 92%   BMI 28.59 kg/m   Assessment and Plan: 1. Annual physical exam Normal exam - weight is decreasing steadily. She declines Flu and Prevnar today. - CBC with Differential/Platelet - Comprehensive metabolic panel - Hemoglobin A1c - Lipid panel - TSH  2. Encounter for screening mammogram for breast cancer Mammogram ordered for [facility]  3. Colon cancer screening Recommend at age 55 due to lack of family history  4. Encounter for screening for cervical cancer - Cytology - PAP  5. Type II diabetes mellitus with complication (HCC) Clinically stable by exam and report without s/s of hypoglycemia. DM complicated by hypertension and dyslipidemia. Tolerating medications well without side effects or other concerns. Will get labs then advise - will likely need a statin - Comprehensive metabolic panel - Hemoglobin A1c - TSH - Microalbumin / creatinine urine ratio  6. Screening for lipid disorders Check labs and advise - Lipid panel  7. Need for vaccination for pneumococcus Declined today  8. Callous ulcer, unspecified ulcer stage ([place]) Recommend follow up with Wound clinic - call if new referral is needed.  Partially dictated using Editor, commissioning. Any errors are unintentional.  Halina Maidens, MD [facility]  12/10/2021

## 2021-12-11 LAB — CBC WITH DIFFERENTIAL/PLATELET
Basophils Absolute: 0.1 10*3/uL (ref 0.0–0.2)
Basos: 1 %
EOS (ABSOLUTE): 0.4 10*3/uL (ref 0.0–0.4)
Eos: 4 %
Hematocrit: 39.5 % (ref 34.0–46.6)
Hemoglobin: 13.4 g/dL (ref 11.1–15.9)
Immature Grans (Abs): 0 10*3/uL (ref 0.0–0.1)
Immature Granulocytes: 0 %
Lymphocytes Absolute: 3.1 10*3/uL (ref 0.7–3.1)
Lymphs: 31 %
MCH: 30.9 pg (ref 26.6–33.0)
MCHC: 33.9 g/dL (ref 31.5–35.7)
MCV: 91 fL (ref 79–97)
Monocytes Absolute: 0.6 10*3/uL (ref 0.1–0.9)
Monocytes: 6 %
Neutrophils Absolute: 5.9 10*3/uL (ref 1.4–7.0)
Neutrophils: 58 %
Platelets: 387 10*3/uL (ref 150–450)
RBC: 4.34 x10E6/uL (ref 3.77–5.28)
RDW: 12.2 % (ref 11.7–15.4)
WBC: 10.1 10*3/uL (ref 3.4–10.8)

## 2021-12-11 LAB — MICROALBUMIN / CREATININE URINE RATIO
Creatinine, Urine: 62.7 mg/dL
Microalb/Creat Ratio: 10 mg/g creat (ref 0–29)
Microalbumin, Urine: 6.5 ug/mL

## 2021-12-11 LAB — LIPID PANEL
Chol/HDL Ratio: 5.3 ratio — ABNORMAL HIGH (ref 0.0–4.4)
Cholesterol, Total: 197 mg/dL (ref 100–199)
HDL: 37 mg/dL — ABNORMAL LOW (ref >39)
LDL Chol Calc (NIH): 122 mg/dL — ABNORMAL HIGH (ref 0–99)
Triglycerides: 216 mg/dL — ABNORMAL HIGH (ref 0–149)
VLDL Cholesterol Cal: 38 mg/dL (ref 5–40)

## 2021-12-11 LAB — COMPREHENSIVE METABOLIC PANEL
ALT: 11 IU/L (ref 0–32)
AST: 15 IU/L (ref 0–40)
Albumin/Globulin Ratio: 1.7 (ref 1.2–2.2)
Albumin: 4.5 g/dL (ref 3.9–4.9)
Alkaline Phosphatase: 86 IU/L (ref 44–121)
BUN/Creatinine Ratio: 21 (ref 9–23)
BUN: 18 mg/dL (ref 6–24)
Bilirubin Total: 0.7 mg/dL (ref 0.0–1.2)
CO2: 24 mmol/L (ref 20–29)
Calcium: 10.5 mg/dL — ABNORMAL HIGH (ref 8.7–10.2)
Chloride: 99 mmol/L (ref 96–106)
Creatinine, Ser: 0.86 mg/dL (ref 0.57–1.00)
Globulin, Total: 2.7 g/dL (ref 1.5–4.5)
Glucose: 151 mg/dL — ABNORMAL HIGH (ref 70–99)
Potassium: 5 mmol/L (ref 3.5–5.2)
Sodium: 138 mmol/L (ref 134–144)
Total Protein: 7.2 g/dL (ref 6.0–8.5)
eGFR: 83 mL/min/{1.73_m2} (ref >59)

## 2021-12-11 LAB — TSH: TSH: 2.19 u[IU]/mL (ref 0.450–4.500)

## 2021-12-11 LAB — HEMOGLOBIN A1C
Est. average glucose Bld gHb Est-mCnc: 180 mg/dL
Hgb A1c MFr Bld: 7.9 % — ABNORMAL HIGH (ref 4.8–5.6)

## 2021-12-12 ENCOUNTER — Other Ambulatory Visit: Payer: Self-pay

## 2021-12-12 LAB — CYTOLOGY - PAP
Comment: NEGATIVE
Diagnosis: NEGATIVE
High risk HPV: NEGATIVE

## 2021-12-12 MED ORDER — ROSUVASTATIN CALCIUM 5 MG PO TABS: 5.0000 mg | ORAL_TABLET | Freq: Every day | ORAL | 0 refills | Status: DC

## 2021-12-21 ENCOUNTER — Ambulatory Visit: Payer: BC Managed Care – PPO | Admitting: Podiatry

## 2021-12-25 DIAGNOSIS — E113293 Type 2 diabetes mellitus with mild nonproliferative diabetic retinopathy without macular edema, bilateral: Secondary | ICD-10-CM | POA: Diagnosis not present

## 2021-12-25 DIAGNOSIS — D3132 Benign neoplasm of left choroid: Secondary | ICD-10-CM | POA: Diagnosis not present

## 2022-01-03 ENCOUNTER — Other Ambulatory Visit: Payer: Self-pay | Admitting: Internal Medicine

## 2022-01-03 DIAGNOSIS — E1129 Type 2 diabetes mellitus with other diabetic kidney complication: Secondary | ICD-10-CM

## 2022-01-03 NOTE — Telephone Encounter (Signed)
Requested Prescriptions  Pending Prescriptions Disp Refills  . lisinopril (ZESTRIL) 5 MG tablet [Pharmacy Med Name: LISINOPRIL 5 MG TABLET] 90 tablet 1    Sig: TAKE 1 TABLET (5 MG TOTAL) BY MOUTH DAILY.     Cardiovascular:  ACE Inhibitors Passed - 01/03/2022  3:26 AM      Passed - Cr in normal range and within 180 days    Creatinine, Ser  Date Value Ref Range Status  12/10/2021 0.86 0.57 - 1.00 mg/dL Final         Passed - K in normal range and within 180 days    Potassium  Date Value Ref Range Status  12/10/2021 5.0 3.5 - 5.2 mmol/L Final         Passed - Patient is not pregnant      Passed - Last BP in normal range    BP Readings from Last 1 Encounters:  12/10/21 118/70         Passed - Valid encounter within last 6 months    Recent Outpatient Visits          3 weeks ago Annual physical exam   [place] [facility] at [facility], Jesse Sans, MD   5 months ago Type II diabetes mellitus with complication [facility])   [place] [facility] and [facility] Medicine at [facility], Jesse Sans, MD   7 months ago Type II diabetes mellitus with complication [facility])    Primary Care and Sports Medicine at [facility], Jesse Sans, MD   8 months ago Type II diabetes mellitus with complication [facility])   [place] [facility] and [facility] Medicine at [facility], Jesse Sans, MD      Future Appointments            In 3 months Army Melia, Jesse Sans, MD [facility] Primary Care and Sports Medicine at [facility], [facility]   In 11 months Army Melia, Jesse Sans, MD [place] Primary Care and Sports Medicine at [facility], [facility]

## 2022-01-08 ENCOUNTER — Ambulatory Visit: Payer: BC Managed Care – PPO

## 2022-01-22 ENCOUNTER — Ambulatory Visit
Admission: RE | Admit: 2022-01-22 | Discharge: 2022-01-22 | Disposition: A | Discharge status: Home / Self Care | Payer: BC Managed Care – PPO | Source: Ambulatory Visit | Attending: Internal Medicine | Admitting: Internal Medicine

## 2022-01-22 DIAGNOSIS — Z124 Encounter for screening for malignant neoplasm of cervix: Secondary | ICD-10-CM | POA: Insufficient documentation

## 2022-01-22 DIAGNOSIS — Z1231 Encounter for screening mammogram for malignant neoplasm of breast: Secondary | ICD-10-CM | POA: Insufficient documentation

## 2022-02-14 ENCOUNTER — Ambulatory Visit: Payer: BC Managed Care – PPO | Admitting: Physician Assistant

## 2022-02-20 ENCOUNTER — Other Ambulatory Visit: Payer: Self-pay | Admitting: Internal Medicine

## 2022-02-20 DIAGNOSIS — E118 Type 2 diabetes mellitus with unspecified complications: Secondary | ICD-10-CM

## 2022-02-20 NOTE — Telephone Encounter (Signed)
Requested Prescriptions  Pending Prescriptions Disp Refills   metFORMIN (GLUCOPHAGE-XR) 500 MG 24 hr tablet [Pharmacy Med Name: METFORMIN HCL ER 500 MG TABLET] 270 tablet 0    Sig: TAKE 3 TABLETS (1,500 MG TOTAL) BY MOUTH DAILY BEFORE SUPPER.     Endocrinology:  Diabetes - Biguanides Failed - 02/20/2022  1:42 AM      Failed - B12 Level in normal range and within 720 days    No results found for: "VITAMINB12"       Passed - Cr in normal range and within 360 days    Creatinine, Ser  Date Value Ref Range Status  12/10/2021 0.86 0.57 - 1.00 mg/dL Final         Passed - HBA1C is between 0 and 7.9 and within 180 days    Hgb A1c MFr Bld  Date Value Ref Range Status  12/10/2021 7.9 (H) 4.8 - 5.6 % Final    Comment:             Prediabetes: 5.7 - 6.4          Diabetes: >6.4          Glycemic control for adults with diabetes: <7.0          Passed - eGFR in normal range and within 360 days    eGFR  Date Value Ref Range Status  12/10/2021 83 >59 mL/min/1.73 Final         Passed - Valid encounter within last 6 months    Recent Outpatient Visits           2 months ago Annual physical exam   [place] [facility] at [facility], Jesse Sans, MD   7 months ago Type II diabetes mellitus with complication [facility])   [place] [facility] and Sports Medicine at [facility], Jesse Sans, MD   8 months ago Type II diabetes mellitus with complication [facility])   [place] [facility] and [facility] Medicine at [facility], Jesse Sans, MD   10 months ago Type II diabetes mellitus with complication [facility])   [place] and Sports Medicine at [facility], Jesse Sans, MD       Future Appointments             In 1 month Army Melia, Jesse Sans, MD [facility] Primary Care and Sports Medicine at [facility], [facility]   In 9 months Glean Hess, MD [facility] Primary Care and Sports Medicine at [facility], [place] within normal limits and completed in the last 12 months    WBC  Date Value Ref Range Status  12/10/2021 10.1 3.4 - 10.8 x10E3/uL Final   RBC  Date Value Ref Range Status  12/10/2021 4.34 3.77 - 5.28 x10E6/uL Final   Hemoglobin  Date Value Ref Range Status  12/10/2021 13.4 11.1 - 15.9 g/dL Final   Hematocrit  Date Value Ref Range Status  12/10/2021 39.5 34.0 - 46.6 % Final   MCHC  Date Value Ref Range Status  12/10/2021 33.9 31.5 - 35.7 g/dL Final   [facility]  Date Value Ref Range Status  12/10/2021 30.9 26.6 - 33.0 pg Final   MCV  Date Value Ref Range Status  12/10/2021 91 79 - 97 fL Final   No results found for: "PLTCOUNTKUC", "LABPLAT", "POCPLA" RDW  Date Value Ref Range Status  12/10/2021  12.2 11.7 - 15.4 % Final

## 2022-02-27 ENCOUNTER — Encounter: Payer: BC Managed Care – PPO | Attending: Physician Assistant | Admitting: Internal Medicine

## 2022-02-27 DIAGNOSIS — F17200 Nicotine dependence, unspecified, uncomplicated: Secondary | ICD-10-CM | POA: Insufficient documentation

## 2022-02-27 DIAGNOSIS — L97512 Non-pressure chronic ulcer of other part of right foot with fat layer exposed: Secondary | ICD-10-CM | POA: Insufficient documentation

## 2022-02-27 DIAGNOSIS — E1142 Type 2 diabetes mellitus with diabetic polyneuropathy: Secondary | ICD-10-CM | POA: Insufficient documentation

## 2022-02-27 DIAGNOSIS — E1143 Type 2 diabetes mellitus with diabetic autonomic (poly)neuropathy: Secondary | ICD-10-CM | POA: Diagnosis not present

## 2022-02-27 DIAGNOSIS — E11621 Type 2 diabetes mellitus with foot ulcer: Secondary | ICD-10-CM | POA: Insufficient documentation

## 2022-02-27 NOTE — Progress Notes (Signed)
Sally Graham, Sally Graham ([id number]) [id number] Page 1 of 6 Visit Report for 02/27/2022 Chief Complaint Document Details Patient Name: Date of Service: Sally Graham [place] Graham. 02/27/2022 8:45 A M Medical Record Number: [id number] Patient Account Number: [id number] Date of Birth/Sex: Treating RN: 1970-09-25 (51 y.o. Orvan Falconer Primary Care Provider: Halina Maidens Other Clinician: Referring Provider: Treating Provider/Extender: Kalman Shan Self, Referral Weeks in Treatment: 0 Information Obtained from: Patient Chief Complaint 02/27/2022; callus to the lateral right foot Electronic Signature(s) Signed: 02/27/2022 9:53:47 AM By: Kalman Shan DO Entered By: Kalman Shan on 02/27/2022 09:35:59 -------------------------------------------------------------------------------- HPI Details Patient Name: Date of Service: Sally Graham, Sally Graham. 02/27/2022 8:45 A M Medical Record Number: [id number] Patient Account Number: [id number] Date of Birth/Sex: Treating RN: [birth year] (51 y.o. Orvan Falconer Primary Care Provider: Halina Maidens Other Clinician: Referring Provider: Treating Provider/Extender: Kalman Shan Self, Referral Weeks in Treatment: 0 History of Present Illness HPI Description: 06-26-2021 upon evaluation today patient presents for a wound over the plantar aspect of her right lateral foot and the fifth metatarsal area. She tells me that this is a longstanding issue which has been developing over quite some time. Although the first time she ever noted an open wound was somewhere around November 16, 2020. Nonetheless she has been fighting dealing with callus over this area for quite some time. She tells me currently that this overall she is gradually. She has tried multiple things to try to get rid of the callus that she just did not want it on there. She in fact used a callus remover it was only supposed down for 6 hours she tells me she left it on for 12  and that seems to have exacerbated things to some degree. The patient also is a diabetic and really has not been rightly controlled due to not having insurance. She just got back on the metformin March 2023. Her most recent hemoglobin A1c on April 17, 2021 was 11.4. Of note she is a daily smoker she also was on Levaquin for 1 week starting on March 15 that is complete at this time. She did have an x-ray on April 18, 2021 which was negative for osteo-. Overall she really just feels like that the callus needs to be removed and what it was underneath treated. She is also unsure what to do to try to prevent the callus from continued to build. The patient does have a history of cigarette smoking which I will delay the conversation with her till the next visit as there was a lot going on today just dealing with the wounds themselves. Nonetheless this is definitely something that needs to be addressed. She also again is a type II diabetic with peripheral neuropathy which is a big part of the issue here as well. 07-02-2021 upon evaluation today patient actually appears to be completely healed which is absolutely amazing. She had a fairly significant wound with a ton of callus noted last week and subsequently we were able to clear this away. The wound in the interim from last week till this week healed in an excellent fashion this is completely closed with just some callus noted I am going to clear away the callus just to make sure that there is nothing hiding up underneath but Sally Graham Graham ([id number]) [id number] Page 2 of 6 honestly I really feel like she is doing excellent at this point. 02/27/2022 Sally Graham is a 51 year old female with a past medical history of type 2 diabetes on oral agents  with last hemoglobin A1c of 7.9. This has trended down over the past year from 11.4. She has been seen in our clinic previously in April 2023 for a right lateral posterior wound  treated with silver alginate. She healed up fairly quickly. She follows with Dr. Amalia Hailey, podiatry. She had specialized inserts to help with offloading. For the past 6 months she has developed a thickened callus again to the right lateral aspect. She has no open wounds. She denies any drainage. Electronic Signature(s) Signed: 02/27/2022 9:53:47 AM By: Kalman Shan DO Entered By: Kalman Shan on 02/27/2022 09:37:35 -------------------------------------------------------------------------------- Physical Exam Details Patient Name: Date of Service: Sally Graham, Sally WN Graham. 02/27/2022 8:45 A M Medical Record Number: [id number] Patient Account Number: [id number] Date of Birth/Sex: Treating RN: July 09, 1970 (51 y.o. Orvan Falconer Primary Care Provider: Halina Maidens Other Clinician: Referring Provider: Treating Provider/Extender: Kalman Shan Self, Referral Weeks in Treatment: 0 Constitutional . Cardiovascular . Psychiatric . Notes Right foot: T the fifth met heads there is a thickened callus. This this area is hardened with no fluctuance. No drainage noted. o Electronic Signature(s) Signed: 02/27/2022 9:53:47 AM By: Kalman Shan DO Entered By: Kalman Shan on 02/27/2022 09:38:32 -------------------------------------------------------------------------------- Physician Orders Details Patient Name: Date of Service: Sally Graham, Sally Graham. 02/27/2022 8:45 A M Medical Record Number: [id number] Patient Account Number: [id number] Date of Birth/Sex: Treating RN: 1970-10-16 (51 y.o. 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 02/27/2022 8:45 A M Medical Record Number: [id number] Patient Account Number: [id number] Date of Birth/Sex: Treating RN: 1970/03/31 (51 y.o. 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 02/27/2022 No Yes E11.628 Type 2 diabetes mellitus with other skin complications 14/48/1856 No Yes L84 Corns and callosities 02/27/2022 No Yes Inactive Problems Resolved Problems Electronic Signature(s) Signed: 02/27/2022 9:53:47 AM By: Kalman Shan DO Entered By: Kalman Shan on 02/27/2022 09:35:22 Sally Graham, Sally Graham ([id number]) [id number] Page 4 of 6 -------------------------------------------------------------------------------- Progress Note Details Patient Name: Date of Service: Sally Graham, Sally Graham 02/27/2022 8:45 A M Medical Record Number: [id number] Patient Account Number: [id number] Date of Birth/Sex: Treating RN: 1970-04-02 (51 y.o. Orvan Falconer Primary Care Provider: Halina Maidens Other Clinician: Referring Provider: Treating Provider/Extender: Kalman Shan Self, Referral Weeks in Treatment: 0 Subjective Chief Complaint Information obtained from  Patient 02/27/2022; callus to the lateral right foot History of Present Illness (HPI) 06-26-2021 upon evaluation today patient presents for a wound over the plantar aspect of her right lateral foot and the fifth metatarsal area. She tells me that this is a longstanding issue which has been developing over quite some time. Although the first time she ever noted an open wound was somewhere around  November 16, 2020. Nonetheless she has been fighting dealing with callus over this area for quite some time. She tells me currently that this overall she is gradually. She has tried multiple things to try to get rid of the callus that she just did not want it on there. She in fact used a callus remover it was only supposed down for 6 hours she tells me she left it on for 12 and that seems to have exacerbated things to some degree. The patient also is a diabetic and really has not been rightly controlled due to not having insurance. She just got back on the metformin March 2023. Her most recent hemoglobin A1c on April 17, 2021 was 11.4. Of note she is a daily smoker she also was on Levaquin for 1 week starting on March 15 that is complete at this time. She did have an x-ray on April 18, 2021 which was negative for osteo-. Overall she really just feels like that the callus needs to be removed and what it was underneath treated. She is also unsure what to do to try to prevent the callus from continued to build. The patient does have a history of cigarette smoking which I will delay the conversation with her till the next visit as there was a lot going on today just dealing with the wounds themselves. Nonetheless this is definitely something that needs to be addressed. She also again is a type II diabetic with peripheral neuropathy which is a big part of the issue here as well. 07-02-2021 upon evaluation today patient actually appears to be completely healed which is absolutely amazing. She had a fairly  significant wound with a ton of callus noted last week and subsequently we were able to clear this away. The wound in the interim from last week till this week healed in an excellent fashion this is completely closed with just some callus noted I am going to clear away the callus just to make sure that there is nothing hiding up underneath but honestly I really feel like she is doing excellent at this point. 02/27/2022 Sally Graham is a 51 year old female with a past medical history of type 2 diabetes on oral agents with last hemoglobin A1c of 7.9. This has trended down over the past year from 11.4. She has been seen in our clinic previously in April 2023 for a right lateral posterior wound treated with silver alginate. She healed up fairly quickly. She follows with Dr. Amalia Hailey, podiatry. She had specialized inserts to help with offloading. For the past 6 months she has developed a thickened callus again to the right lateral aspect. She has no open wounds. She denies any drainage. Patient History Allergies No Known Drug Allergies Social History Current every day smoker - 1/2 PACK TO A PACK DAILY 34 YRS, Alcohol Use - Rarely, Drug Use - No History, Caffeine Use - Daily - COFFEE TEA SODA. Medical History Endocrine Patient has history of Type II Diabetes Objective Constitutional Vitals Time Taken: 9:00 AM, Temperature: 98.2 F, Pulse: 74 bpm, Respiratory Rate: 18 breaths/min, Blood Pressure: 130/80 mmHg. General Notes: Right foot: T the fifth met heads there is a thickened callus. This this area is hardened with no fluctuance. No drainage noted. Sally Graham, Sally Graham ([id number]) [id number] Page 5 of 6 Assessment Active Problems ICD-10 Type 2 diabetes mellitus with diabetic autonomic (poly)neuropathy Type 2 diabetes mellitus with other skin complications Corns and callosities Patient presents to the clinic for concern of callus to  the right lateral foot. She has no  open wounds. At this time I recommended she follow-up with podiatry for callus management. She has custom inserts to help with offloading and I recommended she be reevaluated with podiatry to see if there is anything further to do for offloading. We discussed the importance of glycemic control as she is high risk for infection and amputation if she develops open wounds to her feet. She expressed understanding. She states she is following with her primary care physician for her diabetes. She may follow-up as needed. Plan Discharge From [facility] Services: Consult Only - follow up with podiatry 1. Follow-up with podiatry 2. Follow-up in wound care clinic as needed Electronic Signature(s) Signed: 02/27/2022 9:53:47 AM By: Kalman Shan DO Entered By: Kalman Shan on 02/27/2022 09:41:33 -------------------------------------------------------------------------------- ROS/PFSH Details Patient Name: Date of Service: Sally Graham, [place] Graham. 02/27/2022 8:45 A M Medical Record Number: [id number] Patient Account Number: [id number] Date of Birth/Sex: Treating RN: Jul 26, 1970 (51 y.o. Orvan Falconer Primary Care Provider: Halina Maidens Other Clinician: Referring Provider: Treating Provider/Extender: Kalman Shan Self, Referral Weeks in Treatment: 0 Endocrine Medical History: Positive for: Type II Diabetes Time with diabetes: 10 YEARS Treated with: Oral agents Blood sugar tested every day: No Immunizations Pneumococcal Vaccine: Received Pneumococcal Vaccination: No Implantable Devices None Family and Social History Sally Graham, Sally Graham ([id number]) [id number] Page 6 of 6 Current every day smoker - 1/2 PACK TO A PACK DAILY 34 YRS; Alcohol Use: Rarely; Drug Use: No History; Caffeine Use: Daily - COFFEE TEA SODA Electronic Signature(s) Signed: 02/27/2022 9:53:47 AM By: Kalman Shan DO Signed: 02/27/2022 3:37:15 PM By: Carlene Coria RN Entered By: Carlene Coria on  02/27/2022 09:02:57 -------------------------------------------------------------------------------- SuperBill Details Patient Name: Date of Service: Sally Coyer Graham. 02/27/2022 Medical Record Number: [id number] Patient Account Number: [id number] Date of Birth/Sex: Treating RN: [birth year] (51 y.o. 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

## 2022-02-27 NOTE — Progress Notes (Signed)
Sally, Graham B ([id number]) [id number] Page 1 of 5 Visit Report for 02/27/2022 Abuse Risk Screen Details Patient Name: Date of Service: Sally Graham, Sally Graham 02/27/2022 8:45 A M Medical Record Number: [id number] Patient Account Number: [id number] Date of Birth/Sex: Treating RN: 01-Dec-1970 (51 y.o. Orvan Falconer Primary Care Shawnmichael Parenteau: Halina Maidens Other Clinician: Referring Disaya Walt: Treating Maykel Reitter/Extender: Kalman Shan Self, Referral Weeks in Treatment: 0 Abuse Risk Screen Items Answer ABUSE RISK SCREEN: Has anyone close to you tried to hurt or harm you recentlyo No Do you feel uncomfortable with anyone in your familyo No Has anyone forced you do things that you didnt want to doo No Electronic Signature(s) Signed: 02/27/2022 3:37:15 PM By: Carlene Coria RN Entered By: Carlene Coria on 02/27/2022 09:03:04 -------------------------------------------------------------------------------- Activities of Daily Living Details Patient Name: Date of Service: Sally, Graham 02/27/2022 8:45 A M Medical Record Number: [id number] Patient Account Number: [id number] Date of Birth/Sex: Treating RN: [birth year] (51 y.o. Orvan Falconer Primary Care Tayvion Lauder: Halina Maidens Other Clinician: Referring [place] Shannahan: Treating Neeka Urista/Extender: Kalman Shan Self, Referral Weeks in Treatment: 0 Activities of Daily Living Items Answer Activities of Daily Living (Please select one for each item) Drive Automobile Completely Able T Medications ake Completely Able Use T elephone Completely Able Care for Appearance Completely Able Use T oilet Completely Able Bath / Shower Completely Able Dress Self Completely Able Feed Self Completely Able Walk Completely Able Get In / Out Bed Completely Able Housework Completely IRISH, BREISCH B ([id number]) [phone] [id number] Page 2 of 5 Prepare Meals Completely Able Handle Money Completely  Able Shop for Self Completely Able Electronic Signature(s) Signed: 02/27/2022 3:37:15 PM By: Carlene Coria RN Entered By: Carlene Coria on 02/27/2022 09:03:45 -------------------------------------------------------------------------------- Education Screening Details Patient Name: Date of Service: Sally Coyer B. 02/27/2022 8:45 A M Medical Record Number: [id number] Patient Account Number: [id number] Date of Birth/Sex: Treating RN: Mar 22, 1970 (51 y.o. Orvan Falconer Primary Care Tiani Stanbery: Halina Maidens Other Clinician: Referring Ellinore Merced: Treating Sheffield Hawker/Extender: Kalman Shan Self, Referral Weeks in Treatment: 0 Primary Learner Assessed: Patient Learning Preferences/Education Level/Primary Language Learning Preference: Explanation Highest Education Level: College or Above Preferred Language: English Cognitive Barrier Language Barrier: No Translator Needed: No Memory Deficit: No Emotional Barrier: No Cultural/Religious Beliefs Affecting Medical Care: No Physical Barrier Impaired Vision: No Impaired Hearing: No Decreased Hand dexterity: No Knowledge/Comprehension Knowledge Level: Medium Comprehension Level: High Ability to understand written instructions: High Ability to understand verbal instructions: High Motivation Anxiety Level: Anxious Cooperation: Cooperative Education Importance: Acknowledges Need Interest in Health Problems: Asks Questions Perception: Coherent Willingness to Engage in Self-Management High Activities: Readiness to Engage in Self-Management High Activities: Electronic Signature(s) Signed: 02/27/2022 3:37:15 PM By: Carlene Coria RN Entered By: Carlene Coria on 02/27/2022 09:05:25 [name], [name] ([id number]) [id number] Page 3 of 5 -------------------------------------------------------------------------------- Fall Risk Assessment Details Patient Name: Date of Service: [name], Sally B. 02/27/2022 8:45 A  M Medical Record Number: [id number] Patient Account Number: [id number] Date of Birth/Sex: Treating RN: 17-Jun-1970 (51 y.o. Orvan Falconer Primary Care Lalaine Overstreet: Halina Maidens Other Clinician: Referring Asti Mackley: Treating Reginae Wolfrey/Extender: Kalman Shan Self, Referral Weeks in Treatment: 0 Fall Risk Assessment Items Have you had 2 or more falls in the last 12 monthso 0 No Have you had any fall that resulted in injury in the last 12 monthso 0 No FALLS RISK SCREEN History of falling - immediate or within 3 months 0 No Secondary diagnosis (Do you have 2 or more medical diagnoseso) 0 No Ambulatory aid None/bed rest/wheelchair/nurse  0 No Crutches/cane/walker 0 No Furniture 0 No Intravenous therapy Access/Saline/Heparin Lock 0 No Gait/Transferring Normal/ bed rest/ wheelchair 0 No Weak (short steps with or without shuffle, stooped but able to lift head while walking, may seek 0 No support from furniture) Impaired (short steps with shuffle, may have difficulty arising from chair, head down, impaired 0 No balance) Mental Status Oriented to own ability 0 No Electronic Signature(s) Signed: 02/27/2022 3:37:15 PM By: Carlene Coria RN Entered By: Carlene Coria on 02/27/2022 09:05:32 -------------------------------------------------------------------------------- Foot Assessment Details Patient Name: Date of Service: Sally Coyer B. 02/27/2022 8:45 A M Medical Record Number: [id number] Patient Account Number: [id number] Date of Birth/Sex: Treating RN: 1971/02/07 (51 y.o. Orvan Falconer Primary Care Kelcee Bjorn: Halina Maidens Other Clinician: Referring Deyon Chizek: Treating Malini Flemings/Extender: Kalman Shan Self, Referral Weeks in Treatment: 0 Foot Assessment Items Site Locations [place], [place] B ([id number]) [id number] Page 4 of 5 + = Sensation present, - = Sensation absent, C = Callus, U = Ulcer R = Redness, W = Warmth, M = Maceration, PU =  Pre-ulcerative lesion F = Fissure, S = Swelling, D = Dryness Assessment Right: Left: Other Deformity: No No Prior Foot Ulcer: No No Prior Amputation: No No Charcot Joint: No No Ambulatory Status: Ambulatory Without Help Gait: Steady Electronic Signature(s) Signed: 02/27/2022 3:37:15 PM By: Carlene Coria RN Entered By: Carlene Coria on 02/27/2022 09:08:17 -------------------------------------------------------------------------------- Nutrition Risk Screening Details Patient Name: Date of Service: JORETTA, EADS 02/27/2022 8:45 A M Medical Record Number: [id number] Patient Account Number: [id number] Date of Birth/Sex: Treating RN: [birth year] (51 y.o. Orvan Falconer Primary Care Tamarion Haymond: Halina Maidens Other Clinician: Referring Jeannine Pennisi: Treating Renaye Janicki/Extender: Kalman Shan Self, Referral Weeks in Treatment: 0 Height (in): Weight (lbs): Body Mass Index (BMI): Nutrition Risk Screening Items Score Screening NUTRITION RISK SCREEN: I have an illness or condition that made me change the kind and/or amount of food I eat 2 Yes I eat fewer than two meals per day 0 No I eat few fruits and vegetables, or milk products 0 No I have three or more drinks of beer, liquor or wine almost every day 0 No I have tooth or mouth problems that make it hard for me to eat 0 No I don't always have enough money to buy the food I need 0 No Mcelhiney, Adysson B ([id number]) [id number] Page 5 of 5 I eat alone most of the time 0 No I take three or more different prescribed or over-the-counter drugs a day 1 Yes Without wanting to, I have lost or gained 10 pounds in the last six months 0 No I am not always physically able to shop, cook and/or feed myself 0 No Nutrition Protocols Good Risk Protocol Moderate Risk Protocol 0 Provide education on nutrition High Risk Proctocol Risk Level: Moderate Risk Score: 3 Electronic Signature(s) Signed: 02/27/2022 3:37:15 PM By:  Carlene Coria RN Entered By: Carlene Coria on 02/27/2022 09:06:10

## 2022-02-27 NOTE — Progress Notes (Signed)
MASIYAH, JORSTAD B ([id number]) [id number] Page 1 of 7 Visit Report for 02/27/2022 Allergy List Details Patient Name: Date of Service: CHELBY, SALATA [place] B. 02/27/2022 8:45 A M Medical Record Number: [id number] Patient Account Number: [id number] Date of Birth/Sex: Treating RN: May 13, 1970 (51 y.o. Orvan Falconer Primary Care Prathik Aman: Halina Maidens Other Clinician: Referring Tally Mattox: Treating Bernedette Auston/Extender: Kalman Shan Self, Referral Weeks in Treatment: 0 Allergies Active Allergies No Known Drug Allergies Allergy Notes Electronic Signature(s) Signed: 02/27/2022 3:37:15 PM By: Carlene Coria RN Entered By: Carlene Coria on 02/27/2022 09:02:19 -------------------------------------------------------------------------------- Arrival Information Details Patient Name: Date of Service: [name] 02/27/2022 8:45 A M Medical Record Number: [id number] Patient Account Number: [id number] Date of Birth/Sex: Treating RN: 02/19/1971 (51 y.o. Orvan Falconer Primary Care Mikalah Skyles: Halina Maidens Other Clinician: Referring Cyrus Ramsburg: Treating Saisha Hogue/Extender: Kalman Shan Self, Referral Weeks in Treatment: 0 Visit Information Patient Arrived: Ambulatory Arrival Time: 08:58 Accompanied By: self Transfer Assistance: None Patient Identification Verified: Yes Secondary Verification Process Completed: Yes Patient Requires Transmission-Based Precautions: No Patient Has Alerts: Yes Patient Alerts: ABI R .92 History Since Last Visit Added or deleted any medications: No Any new allergies or adverse reactions: No Had a fall or experienced change in activities of daily living that may affect risk of falls: No Signs or symptoms of abuse/neglect since last visito No Hospitalized since last visit: No Implantable device outside of the clinic excluding cellular tissue based products placed in the center since last visit: No Pain Present Now: No Electronic  Signature(s) Liguori, Benetta B ([id number]) [id number] Page 2 of 7 Signed: 02/27/2022 3:37:15 PM By: Carlene Coria RN Entered By: Carlene Coria on 02/27/2022 09:01:07 -------------------------------------------------------------------------------- Clinic Level of Care Assessment Details Patient Name: Date of Service: ASRA, GAMBREL 02/27/2022 8:45 A M Medical Record Number: [id number] Patient Account Number: [id number] Date of Birth/Sex: Treating RN: 1971-01-06 (51 y.o. Orvan Falconer Primary Care Jalexus Brett: Halina Maidens Other Clinician: Referring Brayen Bunn: Treating Jisell Majer/Extender: Kalman Shan Self, Referral Weeks in Treatment: 0 Clinic Level of Care Assessment Items TOOL 2 Quantity Score X- 1 0 Use when only an EandM is performed on the INITIAL visit ASSESSMENTS - Nursing Assessment / Reassessment X- 1 20 General Physical Exam (combine w/ comprehensive assessment (listed just below) when performed on new pt. evals) X- 1 25 Comprehensive Assessment (HX, ROS, Risk Assessments, Wounds Hx, etc.) ASSESSMENTS - Wound and Skin A ssessment / Reassessment '[]'$  - 0 Simple Wound Assessment / Reassessment - one wound '[]'$  - 0 Complex Wound Assessment / Reassessment - multiple wounds '[]'$  - 0 Dermatologic / Skin Assessment (not related to wound area) ASSESSMENTS - Ostomy and/or Continence Assessment and Care '[]'$  - 0 Incontinence Assessment and Management '[]'$  - 0 Ostomy Care Assessment and Management (repouching, etc.) PROCESS - Coordination of Care '[]'$  - 0 Simple Patient / Family Education for ongoing care '[]'$  - 0 Complex (extensive) Patient / Family Education for ongoing care '[]'$  - 0 Staff obtains Programmer, systems, Records, T Results / Process Orders est '[]'$  - 0 Staff telephones HHA, Nursing Homes / Clarify orders / etc '[]'$  - 0 Routine Transfer to another Facility (non-emergent condition) '[]'$  - 0 Routine Hospital Admission (non-emergent condition) '[]'$  - 0 New  Admissions / Biomedical engineer / Ordering NPWT Apligraf, etc. , '[]'$  - 0 Emergency Hospital Admission (emergent condition) '[]'$  - 0 Simple Discharge Coordination '[]'$  - 0 Complex (extensive) Discharge Coordination PROCESS - Special Needs '[]'$  - 0 Pediatric / Minor Patient Management '[]'$  - 0 Isolation Patient Management '[]'$  - 0  Hearing / Language / Visual special needs '[]'$  - 0 Assessment of Community assistance (transportation, D/C planning, etc.) '[]'$  - 0 Additional assistance / Altered mentation Hollen, Arabel B ([id number]) [id number] Page 3 of 7 '[]'$  - 0 Support Surface(s) Assessment (bed, cushion, seat, etc.) INTERVENTIONS - Wound Cleansing / Measurement '[]'$  - 0 Wound Imaging (photographs - any number of wounds) '[]'$  - 0 Wound Tracing (instead of photographs) '[]'$  - 0 Simple Wound Measurement - one wound '[]'$  - 0 Complex Wound Measurement - multiple wounds '[]'$  - 0 Simple Wound Cleansing - one wound '[]'$  - 0 Complex Wound Cleansing - multiple wounds INTERVENTIONS - Wound Dressings '[]'$  - 0 Small Wound Dressing one or multiple wounds '[]'$  - 0 Medium Wound Dressing one or multiple wounds '[]'$  - 0 Large Wound Dressing one or multiple wounds '[]'$  - 0 Application of Medications - injection INTERVENTIONS - Miscellaneous '[]'$  - 0 External ear exam '[]'$  - 0 Specimen Collection (cultures, biopsies, blood, body fluids, etc.) '[]'$  - 0 Specimen(s) / Culture(s) sent or taken to Lab for analysis '[]'$  - 0 Patient Transfer (multiple staff / Civil Service fast streamer / Similar devices) '[]'$  - 0 Simple Staple / Suture removal (25 or less) '[]'$  - 0 Complex Staple / Suture removal (26 or more) '[]'$  - 0 Hypo / Hyperglycemic Management (close monitor of Blood Glucose) '[]'$  - 0 Ankle / Brachial Index (ABI) - do not check if billed separately Has the patient been seen at the hospital within the last three years: Yes Total Score: 45 Level Of Care: New/Established - Level 2 Electronic Signature(s) Signed:  02/27/2022 3:37:15 PM By: Carlene Coria RN Entered By: Carlene Coria on 02/27/2022 09:18:55 -------------------------------------------------------------------------------- Encounter Discharge Information Details Patient Name: Date of Service: [name] 02/27/2022 8:45 A M Medical Record Number: [id number] Patient Account Number: [id number] Date of Birth/Sex: Treating RN: 1970-06-07 (51 y.o. Orvan Falconer Primary Care Verlaine Embry: Halina Maidens Other Clinician: Referring Jaevion Goto: Treating Raahil Ong/Extender: Kalman Shan Self, Referral Weeks in Treatment: 0 Encounter Discharge Information Items Discharge Condition: Stable Ambulatory Status: Ambulatory Discharge Destination: Home Transportation: Private Auto Accompanied By: self Schedule Follow-up Appointment: Yes Clinical Summary of Care: DEZIRAE, SERVICE ([id number]) [id number] Page 4 of 7 Electronic Signature(s) Signed: 02/27/2022 3:37:15 PM By: Carlene Coria RN Entered By: Carlene Coria on 02/27/2022 09:20:27 -------------------------------------------------------------------------------- Lower Extremity Assessment Details Patient Name: Date of Service: [place], [name] 02/27/2022 8:45 A M Medical Record Number: [id number] Patient Account Number: [id number] Date of Birth/Sex: Treating RN: Jan 17, 1971 (51 y.o. Orvan Falconer Primary Care Rorie Delmore: Halina Maidens Other Clinician: Referring Edan Serratore: Treating Taeko Schaffer/Extender: Kalman Shan Self, Referral Weeks in Treatment: 0 Electronic Signature(s) Signed: 02/27/2022 3:37:15 PM By: Carlene Coria RN Entered By: Carlene Coria on 02/27/2022 09:17:09 -------------------------------------------------------------------------------- Multi Wound Chart Details Patient Name: Date of Service: [place], Jonell Cluck B. 02/27/2022 8:45 A M Medical Record Number: [id number] Patient Account Number: [id number] Date of Birth/Sex: Treating RN: [birth year] (51 y.o. Orvan Falconer Primary Care Alianys Chacko: Halina Maidens Other Clinician: Referring Arda Daggs: Treating Juquan Reznick/Extender: Kalman Shan Self, Referral Weeks in Treatment: 0 Vital Signs Height(in): Pulse(bpm): 74 Weight(lbs): Blood Pressure(mmHg): 130/80 Body Mass Index(BMI): Temperature(F): 98.2 Respiratory Rate(breaths/min): 18 [Treatment Notes:Wound Assessments Treatment Notes] Electronic Signature(s) Signed: 02/27/2022 9:53:47 AM By: Kalman Shan DO Entered By: Kalman Shan on 02/27/2022 09:35:27 Voth, Annabelle B ([id number]) [id number] Page 5 of 7 -------------------------------------------------------------------------------- Multi-Disciplinary Care Plan Details Patient Name: Date of Service: SHABREKA, COULON [place] B. 02/27/2022 8:45 A M Medical Record Number: [id number] Patient Account Number: [id number] Date of  Birth/Sex: Treating RN: 1970/06/12 (51 y.o. Orvan Falconer Primary Care Blandina Renaldo: Halina Maidens Other Clinician: Referring Ardythe Klute: Treating Anurag Scarfo/Extender: Kalman Shan Self, Referral Weeks in Treatment: 0 Active Inactive Electronic Signature(s) Signed: 02/27/2022 3:37:15 PM By: Carlene Coria RN Entered By: Carlene Coria on 02/27/2022 09:19:49 -------------------------------------------------------------------------------- Pain Assessment Details Patient Name: Date of Service: KAITYLN, KALLSTROM 02/27/2022 8:45 A M Medical Record Number: [id number] Patient Account Number: [id number] Date of Birth/Sex: Treating RN: [birth year] (51 y.o. Orvan Falconer Primary Care Felipe Paluch: Halina Maidens Other Clinician: Referring Daylan Juhnke: Treating Kiano Terrien/Extender: Kalman Shan Self, Referral Weeks in Treatment: 0 Active Problems Location of Pain Severity and Description of Pain Patient Has Paino No Site Locations [place], [place] ([id number]) [id number] Page 6 of 7 Pain Management and Medication Current Pain  Management: Electronic Signature(s) Signed: 02/27/2022 3:37:15 PM By: Carlene Coria RN Entered By: Carlene Coria on 02/27/2022 09:01:14 -------------------------------------------------------------------------------- Patient/Caregiver Education Details Patient Name: Date of Service: Idalia Needle 12/13/2023andnbsp8:45 A M Medical Record Number: [id number] Patient Account Number: [id number] Date of Birth/Gender: Treating RN: 1971-01-14 (51 y.o. Orvan Falconer Primary Care Physician: Halina Maidens Other Clinician: Referring Physician: Treating Physician/Extender: Kalman Shan Self, Referral Weeks in Treatment: 0 Education Assessment Education Provided To: Patient Education Topics Provided Wound/Skin Impairment: Methods: Explain/Verbal Responses: State content correctly Electronic Signature(s) Signed: 02/27/2022 3:37:15 PM By: Carlene Coria RN Entered By: Carlene Coria on 02/27/2022 09:19:14 -------------------------------------------------------------------------------- [place] Details Patient Name: Date of Service: [name] 02/27/2022 8:45 A M Medical Record Number: [id number] Patient Account Number: [id number] Date of Birth/Sex: Treating RN: 14-Apr-1970 (51 y.o. Orvan Falconer Primary Care Elliott Lasecki: Halina Maidens Other Clinician: Referring Latana Colin: Treating Edmond Ginsberg/Extender: Kalman Shan Self, Referral Weeks in Treatment: 0 Vital Signs Time Taken: 09:00 Temperature (F): 98.2 Piloto, Jalene B ([id number]) [id number] Page 7 of 7 Pulse (bpm): 74 Respiratory Rate (breaths/min): 18 Blood Pressure (mmHg): 130/80 Reference Range: 80 - 120 mg / dl Electronic Signature(s) Signed: 02/27/2022 3:37:15 PM By: Carlene Coria RN Entered By: Carlene Coria on 02/27/2022 09:02:06

## 2022-03-10 ENCOUNTER — Other Ambulatory Visit: Payer: Self-pay | Admitting: Internal Medicine

## 2022-04-11 ENCOUNTER — Ambulatory Visit: Payer: BC Managed Care – PPO | Admitting: Internal Medicine

## 2022-05-14 ENCOUNTER — Ambulatory Visit: Payer: BC Managed Care – PPO | Admitting: Podiatry

## 2022-05-14 ENCOUNTER — Encounter: Payer: Self-pay | Admitting: Podiatry

## 2022-05-14 VITALS — BP 143/81 | HR 86

## 2022-05-14 DIAGNOSIS — D2371 Other benign neoplasm of skin of right lower limb, including hip: Secondary | ICD-10-CM | POA: Diagnosis not present

## 2022-05-14 DIAGNOSIS — L84 Corns and callosities: Secondary | ICD-10-CM

## 2022-05-14 DIAGNOSIS — E0843 Diabetes mellitus due to underlying condition with diabetic autonomic (poly)neuropathy: Secondary | ICD-10-CM

## 2022-05-14 DIAGNOSIS — E119 Type 2 diabetes mellitus without complications: Secondary | ICD-10-CM | POA: Diagnosis not present

## 2022-05-14 NOTE — Progress Notes (Signed)
   Chief Complaint  Patient presents with   Callouses    "I have a callus.  I'd like to have it removed, for real this time."    Subjective: 52 y.o. female presenting to the office today as a new patient who was recently diagnosed with diabetes mellitus January 2023.  She was actually a referral from the wound care center.  She recently had a wound developed to the plantar aspect of the fifth MTP joint right foot and she was managed at the wound care center and it healed uneventfully.  She now developed symptomatic calluses to the areas.  She presents today to get established and for routine diabetic foot exam as well as preventative measures to prevent calluses or wounds from developing.  Last A1c 04/17/2021 11.4   Past Medical History:  Diagnosis Date   Diabetes mellitus without complication ([place])    Diabetic foot ulcer ([place]) 04/17/2021   Past Surgical History:  Procedure Laterality Date   CESAREAN SECTION  2000   No Known Allergies   RT foot 05/14/2022  Objective:  Physical Exam General: Alert and oriented x3 in no acute distress  Dermatology: Hyperkeratotic lesion(s) present on the plantar aspect of the fifth MTP bilateral. Pain on palpation with a central nucleated core noted. Skin is warm, dry and supple bilateral lower extremities. Negative for open lesions or macerations.  Vascular: Palpable pedal pulses bilaterally. No edema or erythema noted. Capillary refill within normal limits.  Neurological: Epicritic and protective threshold diminished especially in the forefoot bilaterally.   Musculoskeletal Exam: High arches noted which causes excessive pressure to the balls of the feet during ambulation  Assessment: 1.  Diabetes mellitus with peripheral polyneuropathy 2. PMHx ulcer plantar aspect of the fifth MTP right foot 3.  Symptomatic preulcerative callus lesions bilateral feet   Plan of Care:  1. Patient evaluated.  Today we discussed preventative measures and good  foot hygiene especially for patients with diabetes. 2. Excisional debridement of keratoic lesion(s) using a chisel blade was performed without incident.  3.  Advised the patient against going barefoot.  Recommend good supportive shoes and sneakers at all times even in the house 4.  Recommend daily foot lotion. AmLactin recommended. 5.  Appointment with Pedorthist for custom molded orthotics/diabetic shoes and insoles to support the arches and offload pressure from the forefoot 6.  Return to clinic as needed for routine foot care or callus debridement  Edrick Kins, DPM [facility]  Dr. Edrick Kins, [address]                                        [place], [place] [address]                Office [phone]  Fax [phone]

## 2022-05-17 ENCOUNTER — Other Ambulatory Visit: Payer: Self-pay | Admitting: Internal Medicine

## 2022-05-17 DIAGNOSIS — E118 Type 2 diabetes mellitus with unspecified complications: Secondary | ICD-10-CM

## 2022-05-17 NOTE — Telephone Encounter (Signed)
Rx 02/20/22 #[id number]- too soon Requested Prescriptions  Pending Prescriptions Disp Refills   metFORMIN (GLUCOPHAGE-XR) 500 MG 24 hr tablet [Pharmacy Med Name: METFORMIN HCL ER 500 MG TABLET] 270 tablet 0    Sig: TAKE 3 TABLETS (1,500 MG TOTAL) BY MOUTH DAILY BEFORE SUPPER.     Endocrinology:  Diabetes - Biguanides Failed - 05/17/2022  1:54 AM      Failed - B12 Level in normal range and within 720 days    No results found for: "VITAMINB12"       Passed - Cr in normal range and within 360 days    Creatinine, Ser  Date Value Ref Range Status  12/10/2021 0.86 0.57 - 1.00 mg/dL Final         Passed - HBA1C is between 0 and 7.9 and within 180 days    Hgb A1c MFr Bld  Date Value Ref Range Status  12/10/2021 7.9 (H) 4.8 - 5.6 % Final    Comment:             Prediabetes: 5.7 - 6.4          Diabetes: >6.4          Glycemic control for adults with diabetes: <7.0          Passed - eGFR in normal range and within 360 days    eGFR  Date Value Ref Range Status  12/10/2021 83 >59 mL/min/1.73 Final         Passed - Valid encounter within last 6 months    Recent Outpatient Visits           5 months ago Annual physical exam   [place] [facility], Jesse Sans, MD   10 months ago Type II diabetes mellitus with complication [facility])   [facility], Jesse Sans, MD   11 months ago Type II diabetes mellitus with complication [facility])   [place] at [facility], Jesse Sans, MD   1 year ago Type II diabetes mellitus with complication [facility])   [place] at [facility], Jesse Sans, MD       Future Appointments             In 2 weeks [facility] Melia Jesse Sans, MD [place] at [facility]   In 7 months Army Melia Jesse Sans, MD [place] at [facility] [place] within normal limits and completed in the last 12 months    WBC  Date Value Ref Range Status  12/10/2021 10.1 3.4 - 10.8 x10E3/uL Final   RBC  Date Value Ref Range Status  12/10/2021 4.34 3.77 - 5.28 x10E6/uL Final   Hemoglobin  Date Value Ref Range Status  12/10/2021 13.4 11.1 - 15.9 g/dL Final   Hematocrit  Date Value Ref Range Status  12/10/2021 39.5 34.0 - 46.6 % Final   MCHC  Date Value Ref Range Status  12/10/2021 33.9 31.5 - 35.7 g/dL Final   [facility]  Date Value Ref Range Status  12/10/2021 30.9 26.6 - 33.0 pg Final   MCV  Date Value Ref Range Status  12/10/2021 91 79 - 97 fL Final   No results found for: "PLTCOUNTKUC", "LABPLAT", "POCPLA" RDW  Date Value  Ref Range Status  12/10/2021 12.2 11.7 - 15.4 % Final

## 2022-06-03 ENCOUNTER — Ambulatory Visit: Payer: BC Managed Care – PPO | Admitting: Internal Medicine

## 2022-06-03 ENCOUNTER — Encounter: Payer: Self-pay | Admitting: Internal Medicine

## 2022-06-03 VITALS — BP 122/68 | HR 75 | Ht 69.0 in | Wt 201.0 lb

## 2022-06-03 DIAGNOSIS — E118 Type 2 diabetes mellitus with unspecified complications: Secondary | ICD-10-CM

## 2022-06-03 DIAGNOSIS — E785 Hyperlipidemia, unspecified: Secondary | ICD-10-CM

## 2022-06-03 DIAGNOSIS — E1169 Type 2 diabetes mellitus with other specified complication: Secondary | ICD-10-CM | POA: Diagnosis not present

## 2022-06-03 MED ORDER — ROSUVASTATIN CALCIUM 5 MG PO TABS: 5.0000 mg | ORAL_TABLET | Freq: Every day | ORAL | 1 refills | Status: DC

## 2022-06-03 NOTE — Assessment & Plan Note (Addendum)
Clinically stable without s/s of hypoglycemia.  BS have been higher since Christmas despite med compliance and diet. Tolerating metformin well without side effects or other concerns. Lab Results  Component Value Date   HGBA1C 7.9 (H) 12/10/2021  Will add Farxiga 10 mg - samples

## 2022-06-03 NOTE — Patient Instructions (Signed)
Take Farxiga 10 mg once a day. Maintain good hydration.

## 2022-06-03 NOTE — Progress Notes (Signed)
Date:  06/03/2022   Name:  Sally Graham   DOB:  [birth year]   MRN:  [id number]   Chief Complaint: Diabetes (Foot exam.)  Diabetes She presents for her follow-up diabetic visit. She has type 2 diabetes mellitus. Her disease course has been stable. Pertinent negatives for hypoglycemia include no headaches or tremors. Pertinent negatives for diabetes include no chest pain, no fatigue, no polydipsia and no polyuria. Current diabetic treatment includes oral agent (monotherapy). She is compliant with treatment most of the time. Her breakfast blood glucose is taken between 6-7 am. Her breakfast blood glucose range is generally 140-180 mg/dl. An ACE inhibitor/angiotensin II receptor blocker is being taken. Eye exam is current.  Hyperlipidemia This is a chronic problem. Pertinent negatives include no chest pain or shortness of breath. Current antihyperlipidemic treatment includes statins.    Lab Results  Component Value Date   NA 138 12/10/2021   K 5.0 12/10/2021   CO2 24 12/10/2021   GLUCOSE 151 (H) 12/10/2021   BUN 18 12/10/2021   CREATININE 0.86 12/10/2021   CALCIUM 10.5 (H) 12/10/2021   EGFR 83 12/10/2021   Lab Results  Component Value Date   CHOL 197 12/10/2021   HDL 37 (L) 12/10/2021   LDLCALC 122 (H) 12/10/2021   TRIG 216 (H) 12/10/2021   CHOLHDL 5.3 (H) 12/10/2021   Lab Results  Component Value Date   TSH 2.190 12/10/2021   Lab Results  Component Value Date   HGBA1C 7.9 (H) 12/10/2021   Lab Results  Component Value Date   WBC 10.1 12/10/2021   HGB 13.4 12/10/2021   HCT 39.5 12/10/2021   MCV 91 12/10/2021   PLT 387 12/10/2021   Lab Results  Component Value Date   ALT 11 12/10/2021   AST 15 12/10/2021   ALKPHOS 86 12/10/2021   BILITOT 0.7 12/10/2021   No results found for: "25OHVITD2", "25OHVITD3", "VD25OH"   Review of Systems  Constitutional:  Negative for appetite change, fatigue, fever and unexpected weight change.  HENT:  Negative for tinnitus and  trouble swallowing.   Eyes:  Negative for visual disturbance.  Respiratory:  Negative for cough, chest tightness and shortness of breath.   Cardiovascular:  Negative for chest pain, palpitations and leg swelling.  Gastrointestinal:  Negative for abdominal pain.  Endocrine: Negative for polydipsia and polyuria.  Genitourinary:  Negative for dysuria, hematuria and menstrual problem.       Menopausal symptoms and decreased libido  Musculoskeletal:  Negative for arthralgias.  Neurological:  Negative for tremors, numbness and headaches.  Psychiatric/Behavioral:  Negative for dysphoric mood.     Patient Active Problem List   Diagnosis Date Noted   Hyperlipidemia associated with type 2 diabetes mellitus ([place]) 06/03/2022   Callous ulcer ([place]) 12/10/2021   Microalbuminuria due to type 2 diabetes mellitus ([place]) 07/11/2021   Type II diabetes mellitus with complication ([place]) 123XX123    No Known Allergies  Past Surgical History:  Procedure Laterality Date   CESAREAN SECTION  2000    Social History   Tobacco Use   Smoking status: Every Day    Packs/day: 0.50    Years: 15.00    Additional pack years: 0.00    Total pack years: 7.50    Types: Cigarettes   Smokeless tobacco: Never  Vaping Use   Vaping Use: Never used  Substance Use Topics   Alcohol use: Not Currently    Alcohol/week: 3.0 - 4.0 standard drinks of alcohol    Types: 3 -  4 Glasses of wine per week    Comment: occassionally   Drug use: Never     Medication list has been reviewed and updated.  Current Meds  Medication Sig   ACCU-CHEK GUIDE test strip USE TO CHECK BLOOD SUGAR UP TO 4 TIMES DAILY   Accu-Chek Softclix Lancets lancets TEST UP TO FOUR TIMES DAILY   CINNAMON PO Take by mouth.   lisinopril (ZESTRIL) 5 MG tablet TAKE 1 TABLET (5 MG TOTAL) BY MOUTH DAILY.   metFORMIN (GLUCOPHAGE-XR) 500 MG 24 hr tablet TAKE 3 TABLETS (1,500 MG TOTAL) BY MOUTH DAILY BEFORE SUPPER.   Multiple Vitamins-Minerals  (MULTIVIT/MULTIMINERAL ADULT PO) Take by mouth.   Omega-3 Fatty Acids (FISH OIL) 1000 MG CAPS Take by mouth.   [DISCONTINUED] rosuvastatin (CRESTOR) 5 MG tablet TAKE 1 TABLET (5 MG TOTAL) BY MOUTH DAILY.       06/03/2022    8:26 AM 12/10/2021   11:04 AM 07/11/2021    2:39 PM 05/30/2021    1:50 PM  GAD 7 : Generalized Anxiety Score  Nervous, Anxious, on Edge 0 0 0 0  Control/stop worrying 0 0 0 0  Worry too much - different things 0 0 0 0  Trouble relaxing 0 0 0 0  Restless 0 0 0 0  Easily annoyed or irritable 0 0 0 0  Afraid - awful might happen 0 0 0 0  Total GAD 7 Score 0 0 0 0  Anxiety Difficulty Not difficult at all Not difficult at all Not difficult at all Not difficult at all       06/03/2022    8:25 AM 12/10/2021   11:04 AM 07/11/2021    2:39 PM  Depression screen PHQ 2/9  Decreased Interest 0 0 0  Down, Depressed, Hopeless 0 0 0  PHQ - 2 Score 0 0 0  Altered sleeping 0 0 0  Tired, decreased energy 0 0 0  Change in appetite 0 0 0  Feeling bad or failure about yourself  0 0 0  Trouble concentrating 0 0 0  Moving slowly or fidgety/restless 0 0 0  Suicidal thoughts 0 0 0  PHQ-9 Score 0 0 0  Difficult doing work/chores Not difficult at all Not difficult at all Not difficult at all    BP Readings from Last 3 Encounters:  06/03/22 122/68  05/14/22 (!) 143/81  12/10/21 118/70    Physical Exam Vitals and nursing note reviewed.  Constitutional:      General: She is not in acute distress.    Appearance: She is well-developed.  HENT:     Head: Normocephalic and atraumatic.  Cardiovascular:     Rate and Rhythm: Normal rate and regular rhythm.     Pulses: Normal pulses.     Heart sounds: No murmur heard. Pulmonary:     Effort: Pulmonary effort is normal. No respiratory distress.     Breath sounds: No wheezing or rhonchi.  Musculoskeletal:     Cervical back: Normal range of motion.  Lymphadenopathy:     Cervical: No cervical adenopathy.  Skin:    General: Skin is  warm and dry.     Findings: No rash.  Neurological:     Mental Status: She is alert and oriented to person, place, and time.  Psychiatric:        Mood and Affect: Mood normal.        Behavior: Behavior normal.    Diabetic Foot Exam - Simple   Simple Foot Form Diabetic  Foot exam was performed with the following findings: Yes 06/03/2022  8:32 AM  Visual Inspection See comments: Yes Sensation Testing See comments: Yes Pulse Check Posterior Tibialis and Dorsalis pulse intact bilaterally: Yes Comments Decreased sensation both distal feet Large callus right lateral foot      Wt Readings from Last 3 Encounters:  06/03/22 201 lb (91.2 kg)  12/10/21 193 lb 9.6 oz (87.8 kg)  07/11/21 199 lb 9.6 oz (90.5 kg)    BP 122/68   Pulse 75   Ht 5\' 9"  (1.753 m)   Wt 201 lb (91.2 kg)   SpO2 99%   BMI 29.68 kg/m   Assessment and Plan:  Problem List Items Addressed This Visit       Endocrine   Hyperlipidemia associated with type 2 diabetes mellitus ([place]) (Chronic)    Tolerating statin medications without concerns LDL is  Lab Results  Component Value Date   LDLCALC 122 (H) 12/10/2021  with a goal of < 70. Current dose will be adjusted if needed.       Relevant Medications   rosuvastatin (CRESTOR) 5 MG tablet   Other Relevant Orders   Lipid panel   Comprehensive metabolic panel   Type II diabetes mellitus with complication (HCC) - Primary (Chronic)    Clinically stable without s/s of hypoglycemia.  BS have been higher since Christmas despite med compliance and diet. Tolerating metformin well without side effects or other concerns. Lab Results  Component Value Date   HGBA1C 7.9 (H) 12/10/2021  Will add Farxiga 10 mg - samples       Relevant Medications   rosuvastatin (CRESTOR) 5 MG tablet   Other Relevant Orders   Hemoglobin A1c    Return in about 3 months (around 09/03/2022) for DM.   Partially dictated using [place], any errors are not  intentional.  Glean Hess, MD [place], [place]

## 2022-06-03 NOTE — Assessment & Plan Note (Signed)
Tolerating statin medications without concerns LDL is  Lab Results  Component Value Date   LDLCALC 122 (H) 12/10/2021   with a goal of < 70. Current dose will be adjusted if needed.

## 2022-06-04 ENCOUNTER — Other Ambulatory Visit: Payer: Self-pay | Admitting: Internal Medicine

## 2022-06-04 ENCOUNTER — Telehealth: Payer: Self-pay

## 2022-06-04 DIAGNOSIS — E118 Type 2 diabetes mellitus with unspecified complications: Secondary | ICD-10-CM

## 2022-06-04 LAB — COMPREHENSIVE METABOLIC PANEL
ALT: 11 IU/L (ref 0–32)
AST: 18 IU/L (ref 0–40)
Albumin/Globulin Ratio: 1.7 (ref 1.2–2.2)
Albumin: 4.4 g/dL (ref 3.9–4.9)
Alkaline Phosphatase: 90 IU/L (ref 44–121)
BUN/Creatinine Ratio: 22 (ref 9–23)
BUN: 20 mg/dL (ref 6–24)
Bilirubin Total: 0.5 mg/dL (ref 0.0–1.2)
CO2: 23 mmol/L (ref 20–29)
Calcium: 10.3 mg/dL — ABNORMAL HIGH (ref 8.7–10.2)
Chloride: 102 mmol/L (ref 96–106)
Creatinine, Ser: 0.89 mg/dL (ref 0.57–1.00)
Globulin, Total: 2.6 g/dL (ref 1.5–4.5)
Glucose: 154 mg/dL — ABNORMAL HIGH (ref 70–99)
Potassium: 5.3 mmol/L — ABNORMAL HIGH (ref 3.5–5.2)
Sodium: 141 mmol/L (ref 134–144)
Total Protein: 7 g/dL (ref 6.0–8.5)
eGFR: 79 mL/min/{1.73_m2} (ref >59)

## 2022-06-04 LAB — LIPID PANEL
Chol/HDL Ratio: 3.6 ratio (ref 0.0–4.4)
Cholesterol, Total: 142 mg/dL (ref 100–199)
HDL: 39 mg/dL — ABNORMAL LOW (ref >39)
LDL Chol Calc (NIH): 76 mg/dL (ref 0–99)
Triglycerides: 157 mg/dL — ABNORMAL HIGH (ref 0–149)
VLDL Cholesterol Cal: 27 mg/dL (ref 5–40)

## 2022-06-04 LAB — HEMOGLOBIN A1C
Est. average glucose Bld gHb Est-mCnc: 180 mg/dL
Hgb A1c MFr Bld: 7.9 % — ABNORMAL HIGH (ref 4.8–5.6)

## 2022-06-04 MED ORDER — DAPAGLIFLOZIN PROPANEDIOL 10 MG PO TABS: 10.0000 mg | ORAL_TABLET | Freq: Every day | ORAL | 1 refills | Status: DC

## 2022-06-04 NOTE — Telephone Encounter (Signed)
Pt would like a call back regarding abnormal labs.    Cheral Almas, RN 06/04/2022  2:15 PM EDT Back to Top    Patient returned our call. Shared provider's note. Pt would like more information about abnormal lab results.   PT would like to know why her calcium is high, and if this is something to be concerned about. She would also like more information regarding the abnormal Potassium level. PT noticed it is trending up. She would like to know why and should she be concerned about this.   Clista Bernhardt, CMA 06/04/2022  1:54 PM EDT     Tried calling to about lab results- left VM asking pt to call back. PEC nurse may give results when patient returns call. CRM created. Chassidy   Glean Hess, MD 06/04/2022 12:05 PM EDT     DM is unchanged.  Cholesterol is much better.  Kidney and liver are normal. Proceed with Farxiga samples and I will send in an Rx.

## 2022-06-04 NOTE — Telephone Encounter (Signed)
Sent to Dr Army Melia for review of labs for patient.  Sally Graham

## 2022-06-27 ENCOUNTER — Other Ambulatory Visit: Payer: Self-pay | Admitting: Internal Medicine

## 2022-06-27 DIAGNOSIS — E1129 Type 2 diabetes mellitus with other diabetic kidney complication: Secondary | ICD-10-CM

## 2022-06-27 NOTE — Telephone Encounter (Signed)
Requested Prescriptions  Pending Prescriptions Disp Refills   lisinopril (ZESTRIL) 5 MG tablet [Pharmacy Med Name: LISINOPRIL 5 MG TABLET] 90 tablet 1    Sig: TAKE 1 TABLET (5 MG TOTAL) BY MOUTH DAILY.     Cardiovascular:  ACE Inhibitors Failed - 06/27/2022  1:46 AM      Failed - K in normal range and within 180 days    Potassium  Date Value Ref Range Status  06/03/2022 5.3 (H) 3.5 - 5.2 mmol/L Final         Passed - Cr in normal range and within 180 days    Creatinine, Ser  Date Value Ref Range Status  06/03/2022 0.89 0.57 - 1.00 mg/dL Final         Passed - Patient is not pregnant      Passed - Last BP in normal range    BP Readings from Last 1 Encounters:  06/03/22 122/68         Passed - Valid encounter within last 6 months    Recent Outpatient Visits           3 weeks ago Type II diabetes mellitus with complication [facility])   [place] [facility] at [facility], Nyoka Cowden, MD   6 months ago Annual physical exam   [facility] at [facility], Nyoka Cowden, MD   11 months ago Type II diabetes mellitus with complication [facility])   [place] [facility] at [facility], Nyoka Cowden, MD   1 year ago Type II diabetes mellitus with complication [facility])   [place] [facility] at [facility], Nyoka Cowden, MD   1 year ago Type II diabetes mellitus with complication [facility])   [place] [facility] at [facility], Nyoka Cowden, MD       Future Appointments             In 2 months Judithann Graves, Nyoka Cowden, MD [facility] at [facility], [facility]   In 5 months Judithann Graves, Nyoka Cowden, MD [facility] at [facility], [facility]

## 2022-08-11 ENCOUNTER — Other Ambulatory Visit: Payer: Self-pay | Admitting: Internal Medicine

## 2022-08-11 DIAGNOSIS — E118 Type 2 diabetes mellitus with unspecified complications: Secondary | ICD-10-CM

## 2022-09-10 ENCOUNTER — Ambulatory Visit: Payer: BC Managed Care – PPO | Admitting: Podiatry

## 2022-09-10 DIAGNOSIS — E0843 Diabetes mellitus due to underlying condition with diabetic autonomic (poly)neuropathy: Secondary | ICD-10-CM

## 2022-09-10 DIAGNOSIS — L97512 Non-pressure chronic ulcer of other part of right foot with fat layer exposed: Secondary | ICD-10-CM

## 2022-09-10 NOTE — Progress Notes (Signed)
   Chief Complaint  Patient presents with   Diabetes    Patient came in today for right foot callus, A1c-7.9 BG-121    Subjective:  52 y.o. female with PMHx of diabetes mellitus presenting for follow-up evaluation of a symptomatic callus to the right forefoot.  Patient states that since she was seen last visit the callus has returned to the plantar fifth MTP of the right foot.  She states that she has been wearing her diabetic shoes and diabetic insoles but around the house she wears her house slippers.  She is working hard to manage and control her diabetes.  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  Dermatology:  Hyperkeratotic callus lesion noted to the plantar aspect of the fifth MTP of the right foot.  After debridement of the superficial thick callus there was a very stable ulcer present.  Measuring approximately 0.7 x 0.7 x 0.1 cm.  Granular wound base.  No exposed bone muscle tendon ligament or joint.  Good potential for healing.  Periwound intact.  Vascular: Palpable pedal pulses bilaterally. No edema or erythema noted. Capillary refill within normal limits.  Neurological: Light touch and protective threshold diminished bilaterally.   Musculoskeletal Exam: Range of motion within normal limits to all pedal and ankle joints bilateral. Muscle strength 5/5 in all groups bilateral.   Assessment: 1.  Ulcer plantar aspect of the fifth MTP right secondary to diabetes mellitus 2. diabetes mellitus w/ peripheral neuropathy   Plan of Care:  -Patient was evaluated. -Medically necessary excisional debridement including subcutaneous tissue was performed  using a tissue nipper and a chisel blade. Excisional debridement of all the necrotic nonviable tissue down to healthy bleeding viable tissue was performed with post-debridement measurements same as pre-. -Recommend antibiotic cream and a Band-Aid daily -Continue diabetic shoes and custom molded Plastizote insoles with the offloading felt dancers pad to offload pressure from the fifth MTP -Return to clinic 3 months   Felecia Shelling, DPM [facility]  Dr. Felecia Shelling, DPM    [address] [place], [place] [address]                Office [phone]  Fax [phone]

## 2022-09-17 ENCOUNTER — Ambulatory Visit: Payer: BC Managed Care – PPO | Admitting: Internal Medicine

## 2022-09-17 ENCOUNTER — Encounter: Payer: Self-pay | Admitting: Internal Medicine

## 2022-09-17 VITALS — BP 104/68 | HR 72 | Ht 69.0 in | Wt 198.8 lb

## 2022-09-17 DIAGNOSIS — Z7984 Long term (current) use of oral hypoglycemic drugs: Secondary | ICD-10-CM

## 2022-09-17 DIAGNOSIS — E118 Type 2 diabetes mellitus with unspecified complications: Secondary | ICD-10-CM | POA: Diagnosis not present

## 2022-09-17 LAB — POCT GLYCOSYLATED HEMOGLOBIN (HGB A1C): Hemoglobin A1C: 7.5 % — AB (ref 4.0–5.6)

## 2022-09-17 NOTE — Progress Notes (Signed)
Date:  09/17/2022   Name:  Sally Graham   DOB:  September 23, 1970   MRN:  [id number]   Chief Complaint: Diabetes  Diabetes She presents for her follow-up diabetic visit. She has type 2 diabetes mellitus. Her disease course has been improving. Pertinent negatives for hypoglycemia include no headaches, nervousness/anxiousness or tremors. Pertinent negatives for diabetes include no chest pain, no fatigue, no polydipsia and no polyuria. Current diabetic treatment includes oral agent (dual therapy) (farxiga and metformin).    Lab Results  Component Value Date   NA 141 06/03/2022   K 5.3 (H) 06/03/2022   CO2 23 06/03/2022   GLUCOSE 154 (H) 06/03/2022   BUN 20 06/03/2022   CREATININE 0.89 06/03/2022   CALCIUM 10.3 (H) 06/03/2022   EGFR 79 06/03/2022   Lab Results  Component Value Date   CHOL 142 06/03/2022   HDL 39 (L) 06/03/2022   LDLCALC 76 06/03/2022   TRIG 157 (H) 06/03/2022   CHOLHDL 3.6 06/03/2022   Lab Results  Component Value Date   TSH 2.190 12/10/2021   Lab Results  Component Value Date   HGBA1C 7.5 (A) 09/17/2022   Lab Results  Component Value Date   WBC 10.1 12/10/2021   HGB 13.4 12/10/2021   HCT 39.5 12/10/2021   MCV 91 12/10/2021   PLT 387 12/10/2021   Lab Results  Component Value Date   ALT 11 06/03/2022   AST 18 06/03/2022   ALKPHOS 90 06/03/2022   BILITOT 0.5 06/03/2022   No results found for: "25OHVITD2", "25OHVITD3", "VD25OH"   Review of Systems  Constitutional:  Negative for appetite change, fatigue, fever and unexpected weight change.  HENT:  Negative for tinnitus and trouble swallowing.   Eyes:  Negative for visual disturbance.  Respiratory:  Negative for cough, chest tightness and shortness of breath.   Cardiovascular:  Negative for chest pain, palpitations and leg swelling.  Gastrointestinal:  Negative for abdominal pain.  Endocrine: Negative for polydipsia and polyuria.  Genitourinary:  Negative for dysuria, frequency, hematuria and  urgency.  Musculoskeletal:  Negative for arthralgias.  Neurological:  Negative for tremors, numbness and headaches.  Psychiatric/Behavioral:  Negative for dysphoric mood and sleep disturbance. The patient is not nervous/anxious.     Patient Active Problem List   Diagnosis Date Noted   Hyperlipidemia associated with type 2 diabetes mellitus (HCC) 06/03/2022   Callous ulcer (HCC) 12/10/2021   Microalbuminuria due to type 2 diabetes mellitus (HCC) 07/11/2021   Type II diabetes mellitus with complication (HCC) 04/17/2021    No Known Allergies  Past Surgical History:  Procedure Laterality Date   CESAREAN SECTION  2000    Social History   Tobacco Use   Smoking status: Every Day    Packs/day: 0.50    Years: 15.00    Additional pack years: 0.00    Total pack years: 7.50    Types: Cigarettes   Smokeless tobacco: Never  Vaping Use   Vaping Use: Never used  Substance Use Topics   Alcohol use: Not Currently    Alcohol/week: 3.0 - 4.0 standard drinks of alcohol    Types: 3 - 4 Glasses of wine per week    Comment: occassionally   Drug use: Never     Medication list has been reviewed and updated.  Current Meds  Medication Sig   Accu-Chek Softclix Lancets lancets TEST UP TO FOUR TIMES DAILY   CINNAMON PO Take by mouth.   dapagliflozin propanediol (FARXIGA) 10 MG TABS tablet Take  1 tablet (10 mg total) by mouth daily before breakfast.   lisinopril (ZESTRIL) 5 MG tablet TAKE 1 TABLET (5 MG TOTAL) BY MOUTH DAILY.   metFORMIN (GLUCOPHAGE-XR) 500 MG 24 hr tablet TAKE 3 TABLETS (1,500 MG TOTAL) BY MOUTH DAILY BEFORE SUPPER.   Multiple Vitamins-Minerals (MULTIVIT/MULTIMINERAL ADULT PO) Take by mouth.   Omega-3 Fatty Acids (FISH OIL) 1000 MG CAPS Take by mouth.   rosuvastatin (CRESTOR) 5 MG tablet Take 1 tablet (5 mg total) by mouth daily.       09/17/2022   10:44 AM 06/03/2022    8:26 AM 12/10/2021   11:04 AM 07/11/2021    2:39 PM  GAD 7 : Generalized Anxiety Score  Nervous,  Anxious, on Edge 0 0 0 0  Control/stop worrying 0 0 0 0  Worry too much - different things 0 0 0 0  Trouble relaxing 0 0 0 0  Restless 0 0 0 0  Easily annoyed or irritable 0 0 0 0  Afraid - awful might happen 0 0 0 0  Total GAD 7 Score 0 0 0 0  Anxiety Difficulty Not difficult at all Not difficult at all Not difficult at all Not difficult at all       09/17/2022   10:44 AM 06/03/2022    8:25 AM 12/10/2021   11:04 AM  Depression screen PHQ 2/9  Decreased Interest 0 0 0  Down, Depressed, Hopeless 0 0 0  PHQ - 2 Score 0 0 0  Altered sleeping 0 0 0  Tired, decreased energy 0 0 0  Change in appetite 0 0 0  Feeling bad or failure about yourself  0 0 0  Trouble concentrating 0 0 0  Moving slowly or fidgety/restless 0 0 0  Suicidal thoughts 0 0 0  PHQ-9 Score 0 0 0  Difficult doing work/chores Not difficult at all Not difficult at all Not difficult at all    BP Readings from Last 3 Encounters:  09/17/22 104/68  06/03/22 122/68  05/14/22 (!) 143/81    Physical Exam Vitals and nursing note reviewed.  Constitutional:      General: She is not in acute distress.    Appearance: Normal appearance. She is well-developed.  HENT:     Head: Normocephalic and atraumatic.  Neck:     Vascular: No carotid bruit.  Cardiovascular:     Rate and Rhythm: Normal rate and regular rhythm.  Pulmonary:     Effort: Pulmonary effort is normal. No respiratory distress.     Breath sounds: No wheezing or rhonchi.  Musculoskeletal:     Cervical back: Normal range of motion.     Right lower leg: No edema.     Left lower leg: No edema.  Lymphadenopathy:     Cervical: No cervical adenopathy.  Skin:    General: Skin is warm and dry.     Findings: No rash.  Neurological:     Mental Status: She is alert and oriented to person, place, and time.  Psychiatric:        Mood and Affect: Mood normal.        Behavior: Behavior normal.     Wt Readings from Last 3 Encounters:  09/17/22 198 lb 12.8 oz (90.2  kg)  06/03/22 201 lb (91.2 kg)  12/10/21 193 lb 9.6 oz (87.8 kg)    BP 104/68   Pulse 72   Ht 5\' 9"  (1.753 m)   Wt 198 lb 12.8 oz (90.2 kg)   SpO2  97%   BMI 29.36 kg/m   Assessment and Plan:  Problem List Items Addressed This Visit     Type II diabetes mellitus with complication (HCC) - Primary (Chronic)    Blood sugars stable without hypoglycemic symptoms or events. Currently being treated with metformin and farxiga which was started last visit. She has lost 3 lbs and denies any side effects. Lab Results  Component Value Date   HGBA1C 7.9 (H) 06/03/2022  A1c today is 7.5.       Relevant Orders   POCT glycosylated hemoglobin (Hb A1C) (Completed)   Other Visit Diagnoses     Long term current use of oral hypoglycemic drug           No follow-ups on file.   Partially dictated using Dragon software, any errors are not intentional.  Reubin Milan, MD [facility] [place], [place]

## 2022-09-17 NOTE — Assessment & Plan Note (Addendum)
Blood sugars stable without hypoglycemic symptoms or events. Currently being treated with metformin and farxiga which was started last visit. She has lost 3 lbs and denies any side effects. Lab Results  Component Value Date   HGBA1C 7.9 (H) 06/03/2022  A1c today is 7.5.

## 2022-11-07 ENCOUNTER — Other Ambulatory Visit: Payer: Self-pay | Admitting: Internal Medicine

## 2022-11-07 DIAGNOSIS — E118 Type 2 diabetes mellitus with unspecified complications: Secondary | ICD-10-CM

## 2022-11-24 ENCOUNTER — Other Ambulatory Visit: Payer: Self-pay | Admitting: Internal Medicine

## 2022-11-24 DIAGNOSIS — E118 Type 2 diabetes mellitus with unspecified complications: Secondary | ICD-10-CM

## 2022-11-24 DIAGNOSIS — E1169 Type 2 diabetes mellitus with other specified complication: Secondary | ICD-10-CM

## 2022-11-27 ENCOUNTER — Other Ambulatory Visit: Payer: Self-pay | Admitting: Internal Medicine

## 2022-11-27 DIAGNOSIS — R809 Proteinuria, unspecified: Secondary | ICD-10-CM

## 2022-12-10 ENCOUNTER — Ambulatory Visit: Payer: BC Managed Care – PPO | Admitting: Podiatry

## 2022-12-13 ENCOUNTER — Encounter: Payer: Self-pay | Admitting: Internal Medicine

## 2022-12-13 ENCOUNTER — Ambulatory Visit (INDEPENDENT_AMBULATORY_CARE_PROVIDER_SITE_OTHER): Payer: BC Managed Care – PPO | Admitting: Internal Medicine

## 2022-12-13 VITALS — BP 112/64 | HR 79 | Ht 69.0 in | Wt 190.4 lb

## 2022-12-13 DIAGNOSIS — E1169 Type 2 diabetes mellitus with other specified complication: Secondary | ICD-10-CM | POA: Diagnosis not present

## 2022-12-13 DIAGNOSIS — Z1231 Encounter for screening mammogram for malignant neoplasm of breast: Secondary | ICD-10-CM

## 2022-12-13 DIAGNOSIS — M25511 Pain in right shoulder: Secondary | ICD-10-CM

## 2022-12-13 DIAGNOSIS — Z0000 Encounter for general adult medical examination without abnormal findings: Secondary | ICD-10-CM

## 2022-12-13 DIAGNOSIS — Z7984 Long term (current) use of oral hypoglycemic drugs: Secondary | ICD-10-CM

## 2022-12-13 DIAGNOSIS — E785 Hyperlipidemia, unspecified: Secondary | ICD-10-CM | POA: Diagnosis not present

## 2022-12-13 DIAGNOSIS — E118 Type 2 diabetes mellitus with unspecified complications: Secondary | ICD-10-CM | POA: Diagnosis not present

## 2022-12-13 LAB — POCT GLYCOSYLATED HEMOGLOBIN (HGB A1C): Hemoglobin A1C: 6.6 % — AB (ref 4.0–5.6)

## 2022-12-13 NOTE — Assessment & Plan Note (Signed)
LDL is  Lab Results  Component Value Date   LDLCALC 76 06/03/2022   Currently being treated with Crestor with good compliance and no concerns.

## 2022-12-13 NOTE — Progress Notes (Signed)
Date:  12/13/2022   Name:  Sally Graham   DOB:  Sep 05, 1970   MRN:  [id number]   Chief Complaint: Annual Exam Sally Graham is a 52 y.o. female who presents today for her Complete Annual Exam. She feels well. She reports exercising - none. She reports she is sleeping fairly well. Breast complaints - none.  Mammogram: 01/2022 DEXA: none Colonoscopy: none Pap:  11/2021 neg/neg  Health Maintenance Due  Topic Date Due   HIV Screening  Never done   DTaP/Tdap/Td (1 - Tdap) Never done   Colonoscopy  Never done   COVID-19 Vaccine (3 - Pfizer risk series) 08/21/2019   OPHTHALMOLOGY EXAM  09/06/2022   Diabetic kidney evaluation - Urine ACR  12/11/2022    Immunization History  Administered Date(s) Administered   PFIZER(Purple Top)SARS-COV-2 Vaccination 07/02/2019, 07/24/2019    Diabetes She presents for her follow-up diabetic visit. She has type 2 diabetes mellitus. Pertinent negatives for hypoglycemia include no dizziness, headaches, nervousness/anxiousness or tremors. Pertinent negatives for diabetes include no chest pain, no fatigue, no polydipsia and no polyuria. Pertinent negatives for diabetic complications include no CVA, heart disease or nephropathy. Current diabetic treatment includes oral agent (dual therapy). She is compliant with treatment all of the time.  Hyperlipidemia This is a chronic problem. The problem is controlled. Pertinent negatives include no chest pain or shortness of breath. Current antihyperlipidemic treatment includes statins.  Shoulder Pain  The pain is present in the right shoulder. This is a new problem. The problem occurs daily. The problem has been unchanged. The quality of the pain is described as aching. Associated symptoms include a limited range of motion. Pertinent negatives include no fever, inability to bear weight, joint locking, joint swelling, numbness, stiffness or tingling.    Lab Results  Component Value Date   NA 141 06/03/2022   K 5.3 (H)  06/03/2022   CO2 23 06/03/2022   GLUCOSE 154 (H) 06/03/2022   BUN 20 06/03/2022   CREATININE 0.89 06/03/2022   CALCIUM 10.3 (H) 06/03/2022   EGFR 79 06/03/2022   Lab Results  Component Value Date   CHOL 142 06/03/2022   HDL 39 (L) 06/03/2022   LDLCALC 76 06/03/2022   TRIG 157 (H) 06/03/2022   CHOLHDL 3.6 06/03/2022   Lab Results  Component Value Date   TSH 2.190 12/10/2021   Lab Results  Component Value Date   HGBA1C 6.6 (A) 12/13/2022   Lab Results  Component Value Date   WBC 10.1 12/10/2021   HGB 13.4 12/10/2021   HCT 39.5 12/10/2021   MCV 91 12/10/2021   PLT 387 12/10/2021   Lab Results  Component Value Date   ALT 11 06/03/2022   AST 18 06/03/2022   ALKPHOS 90 06/03/2022   BILITOT 0.5 06/03/2022   No results found for: "25OHVITD2", "25OHVITD3", "VD25OH"   Review of Systems  Constitutional:  Negative for chills, fatigue and fever.  HENT:  Negative for congestion, hearing loss, tinnitus, trouble swallowing and voice change.   Eyes:  Negative for visual disturbance.  Respiratory:  Negative for cough, chest tightness, shortness of breath and wheezing.   Cardiovascular:  Negative for chest pain, palpitations and leg swelling.  Gastrointestinal:  Negative for abdominal pain, constipation, diarrhea and vomiting.  Endocrine: Negative for polydipsia and polyuria.  Genitourinary:  Negative for dysuria, frequency, genital sores, vaginal bleeding and vaginal discharge.  Musculoskeletal:  Positive for arthralgias. Negative for gait problem, joint swelling and stiffness.  Skin:  Negative for  color change and rash.  Neurological:  Negative for dizziness, tingling, tremors, light-headedness, numbness and headaches.  Hematological:  Negative for adenopathy. Does not bruise/bleed easily.  Psychiatric/Behavioral:  Negative for dysphoric mood and sleep disturbance. The patient is not nervous/anxious.     Patient Active Problem List   Diagnosis Date Noted   Hyperlipidemia  associated with type 2 diabetes mellitus (HCC) 06/03/2022   Callous ulcer (HCC) 12/10/2021   Microalbuminuria due to type 2 diabetes mellitus (HCC) 07/11/2021   Type II diabetes mellitus with complication (HCC) 04/17/2021    No Known Allergies  Past Surgical History:  Procedure Laterality Date   CESAREAN SECTION  2000    Social History   Tobacco Use   Smoking status: Every Day    Current packs/day: 0.50    Average packs/day: 0.5 packs/day for 15.0 years (7.5 ttl pk-yrs)    Types: Cigarettes   Smokeless tobacco: Never  Vaping Use   Vaping status: Never Used  Substance Use Topics   Alcohol use: Not Currently    Alcohol/week: 3.0 - 4.0 standard drinks of alcohol    Types: 3 - 4 Glasses of wine per week    Comment: occassionally   Drug use: Never     Medication list has been reviewed and updated.  Current Meds  Medication Sig   ACCU-CHEK GUIDE test strip USE TO CHECK BLOOD SUGAR UP TO 4 TIMES DAILY   Accu-Chek Softclix Lancets lancets TEST UP TO FOUR TIMES DAILY   CINNAMON PO Take by mouth.   FARXIGA 10 MG TABS tablet TAKE 1 TABLET BY MOUTH DAILY BEFORE BREAKFAST.   lisinopril (ZESTRIL) 5 MG tablet TAKE 1 TABLET (5 MG TOTAL) BY MOUTH DAILY.   metFORMIN (GLUCOPHAGE-XR) 500 MG 24 hr tablet TAKE 3 TABLETS (1,500 MG TOTAL) BY MOUTH DAILY BEFORE SUPPER.   Multiple Vitamins-Minerals (MULTIVIT/MULTIMINERAL ADULT PO) Take by mouth.   Omega-3 Fatty Acids (FISH OIL) 1000 MG CAPS Take by mouth.   rosuvastatin (CRESTOR) 5 MG tablet TAKE 1 TABLET (5 MG TOTAL) BY MOUTH DAILY.       12/13/2022    8:36 AM 09/17/2022   10:44 AM 06/03/2022    8:26 AM 12/10/2021   11:04 AM  GAD 7 : Generalized Anxiety Score  Nervous, Anxious, on Edge 0 0 0 0  Control/stop worrying 0 0 0 0  Worry too much - different things 0 0 0 0  Trouble relaxing 0 0 0 0  Restless 0 0 0 0  Easily annoyed or irritable 0 0 0 0  Afraid - awful might happen 0 0 0 0  Total GAD 7 Score 0 0 0 0  Anxiety Difficulty Not  difficult at all Not difficult at all Not difficult at all Not difficult at all       12/13/2022    8:36 AM 09/17/2022   10:44 AM 06/03/2022    8:25 AM  Depression screen PHQ 2/9  Decreased Interest 0 0 0  Down, Depressed, Hopeless 0 0 0  PHQ - 2 Score 0 0 0  Altered sleeping 0 0 0  Tired, decreased energy 0 0 0  Change in appetite 0 0 0  Feeling bad or failure about yourself  0 0 0  Trouble concentrating 0 0 0  Moving slowly or fidgety/restless 0 0 0  Suicidal thoughts 0 0 0  PHQ-9 Score 0 0 0  Difficult doing work/chores Not difficult at all Not difficult at all Not difficult at all    BP  Readings from Last 3 Encounters:  12/13/22 112/64  09/17/22 104/68  06/03/22 122/68    Physical Exam Vitals and nursing note reviewed.  Constitutional:      General: She is not in acute distress.    Appearance: She is well-developed.  HENT:     Head: Normocephalic and atraumatic.     Right Ear: Tympanic membrane and ear canal normal.     Left Ear: Tympanic membrane and ear canal normal.     Nose:     Right Sinus: No maxillary sinus tenderness.     Left Sinus: No maxillary sinus tenderness.  Eyes:     General: No scleral icterus.       Right eye: No discharge.        Left eye: No discharge.     Conjunctiva/sclera: Conjunctivae normal.  Neck:     Thyroid: No thyromegaly.     Vascular: No carotid bruit.  Cardiovascular:     Rate and Rhythm: Normal rate and regular rhythm.     Pulses: Normal pulses.     Heart sounds: Normal heart sounds.  Pulmonary:     Effort: Pulmonary effort is normal. No respiratory distress.     Breath sounds: No wheezing.  Chest:  Breasts:    Right: No mass, nipple discharge, skin change or tenderness.     Left: No mass, nipple discharge, skin change or tenderness.  Abdominal:     General: Bowel sounds are normal.     Palpations: Abdomen is soft.     Tenderness: There is no abdominal tenderness.  Musculoskeletal:     Right shoulder: No swelling or  deformity. Decreased range of motion (with abduction and internal rotation). Normal strength. Normal pulse.     Cervical back: Normal range of motion. No erythema.     Right lower leg: No edema.     Left lower leg: No edema.  Lymphadenopathy:     Cervical: No cervical adenopathy.  Skin:    General: Skin is warm and dry.     Findings: No rash.  Neurological:     Mental Status: She is alert and oriented to person, place, and time.     Cranial Nerves: No cranial nerve deficit.     Sensory: No sensory deficit.     Deep Tendon Reflexes: Reflexes are normal and symmetric.  Psychiatric:        Attention and Perception: Attention normal.        Mood and Affect: Mood normal.     Wt Readings from Last 3 Encounters:  12/13/22 190 lb 6.4 oz (86.4 kg)  09/17/22 198 lb 12.8 oz (90.2 kg)  06/03/22 201 lb (91.2 kg)    BP 112/64   Pulse 79   Ht 5\' 9"  (1.753 m)   Wt 190 lb 6.4 oz (86.4 kg)   SpO2 98%   BMI 28.12 kg/m   Assessment and Plan:  Problem List Items Addressed This Visit       Unprioritized   Type II diabetes mellitus with complication (HCC) (Chronic)    Blood sugars stable without hypoglycemic symptoms or events.  Fasting BS 116-139. Current regimen is metformin and farxiga. Changes made last visit are none. Lab Results  Component Value Date   HGBA1C 7.5 (A) 09/17/2022  A1C today = 6.6       Relevant Orders   CBC with Differential/Platelet   Comprehensive metabolic panel   Microalbumin / creatinine urine ratio   TSH   POCT glycosylated hemoglobin (Hb  A1C) (Completed)   Hyperlipidemia associated with type 2 diabetes mellitus (HCC) (Chronic)    LDL is  Lab Results  Component Value Date   LDLCALC 76 06/03/2022   Currently being treated with Crestor with good compliance and no concerns.       Relevant Orders   Lipid panel   Other Visit Diagnoses     Annual physical exam    -  Primary   continue healthy diet and exercise vaccines declined today    Encounter for screening mammogram for breast cancer       schedule at [facility]   Relevant Orders   MM 3D SCREENING MAMMOGRAM BILATERAL BREAST   Long term current use of oral hypoglycemic drug       Right shoulder pain, unspecified chronicity       Tylenol bid SM consult if needed       Return in about 4 months (around 04/14/2023) for DM.    Reubin Milan, MD [facility] [place]

## 2022-12-13 NOTE — Assessment & Plan Note (Addendum)
Blood sugars stable without hypoglycemic symptoms or events.  Fasting BS 116-139. Current regimen is metformin and farxiga. Changes made last visit are none. Lab Results  Component Value Date   HGBA1C 7.5 (A) 09/17/2022  A1C today = 6.6

## 2022-12-13 NOTE — Patient Instructions (Addendum)
Call [facility] to schedule your mammogram at [phone].  Try Tylenol 650 -1000 mg twice a day for shoulder pain - consider sports med consult if no improvement.

## 2022-12-14 LAB — LIPID PANEL
Chol/HDL Ratio: 3.4 {ratio} (ref 0.0–4.4)
Cholesterol, Total: 140 mg/dL (ref 100–199)
HDL: 41 mg/dL (ref >39)
LDL Chol Calc (NIH): 69 mg/dL (ref 0–99)
Triglycerides: 179 mg/dL — ABNORMAL HIGH (ref 0–149)
VLDL Cholesterol Cal: 30 mg/dL (ref 5–40)

## 2022-12-14 LAB — CBC WITH DIFFERENTIAL/PLATELET
Basophils Absolute: 0.1 10*3/uL (ref 0.0–0.2)
Basos: 1 %
EOS (ABSOLUTE): 0.5 10*3/uL — ABNORMAL HIGH (ref 0.0–0.4)
Eos: 5 %
Hematocrit: 44.5 % (ref 34.0–46.6)
Hemoglobin: 14.3 g/dL (ref 11.1–15.9)
Immature Grans (Abs): 0 10*3/uL (ref 0.0–0.1)
Immature Granulocytes: 0 %
Lymphocytes Absolute: 2.8 10*3/uL (ref 0.7–3.1)
Lymphs: 28 %
MCH: 30.2 pg (ref 26.6–33.0)
MCHC: 32.1 g/dL (ref 31.5–35.7)
MCV: 94 fL (ref 79–97)
Monocytes Absolute: 0.7 10*3/uL (ref 0.1–0.9)
Monocytes: 7 %
Neutrophils Absolute: 5.6 10*3/uL (ref 1.4–7.0)
Neutrophils: 59 %
Platelets: 396 10*3/uL (ref 150–450)
RBC: 4.74 x10E6/uL (ref 3.77–5.28)
RDW: 13.1 % (ref 11.7–15.4)
WBC: 9.7 10*3/uL (ref 3.4–10.8)

## 2022-12-14 LAB — COMPREHENSIVE METABOLIC PANEL
ALT: 13 [IU]/L (ref 0–32)
AST: 17 [IU]/L (ref 0–40)
Albumin: 4.8 g/dL (ref 3.9–4.9)
Alkaline Phosphatase: 88 [IU]/L (ref 44–121)
BUN/Creatinine Ratio: 21 (ref 9–23)
BUN: 19 mg/dL (ref 6–24)
Bilirubin Total: 0.8 mg/dL (ref 0.0–1.2)
CO2: 23 mmol/L (ref 20–29)
Calcium: 10.3 mg/dL — ABNORMAL HIGH (ref 8.7–10.2)
Chloride: 104 mmol/L (ref 96–106)
Creatinine, Ser: 0.89 mg/dL (ref 0.57–1.00)
Globulin, Total: 3 g/dL (ref 1.5–4.5)
Glucose: 137 mg/dL — ABNORMAL HIGH (ref 70–99)
Potassium: 5.1 mmol/L (ref 3.5–5.2)
Sodium: 140 mmol/L (ref 134–144)
Total Protein: 7.8 g/dL (ref 6.0–8.5)
eGFR: 79 mL/min/{1.73_m2} (ref >59)

## 2022-12-14 LAB — MICROALBUMIN / CREATININE URINE RATIO
Creatinine, Urine: 40.5 mg/dL
Microalb/Creat Ratio: 31 mg/g{creat} — ABNORMAL HIGH (ref 0–29)
Microalbumin, Urine: 12.6 ug/mL

## 2022-12-14 LAB — TSH: TSH: 2.36 u[IU]/mL (ref 0.450–4.500)

## 2022-12-31 ENCOUNTER — Ambulatory Visit: Payer: BC Managed Care – PPO | Admitting: Podiatry

## 2022-12-31 ENCOUNTER — Encounter: Payer: Self-pay | Admitting: Podiatry

## 2022-12-31 VITALS — BP 118/74 | HR 71

## 2022-12-31 DIAGNOSIS — L97512 Non-pressure chronic ulcer of other part of right foot with fat layer exposed: Secondary | ICD-10-CM | POA: Diagnosis not present

## 2022-12-31 DIAGNOSIS — E0843 Diabetes mellitus due to underlying condition with diabetic autonomic (poly)neuropathy: Secondary | ICD-10-CM | POA: Diagnosis not present

## 2022-12-31 NOTE — Progress Notes (Signed)
   Chief Complaint  Patient presents with   Diabetes    "It's good."    Subjective:  52 y.o. female with PMHx of diabetes mellitus, last A1c 12/13/2022 was 6.6, presenting for follow-up evaluation of a symptomatic callus to the right forefoot.    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  Dermatology:  Hyperkeratotic callus lesion noted to the plantar aspect of the fifth MTP of the right foot.  No open wound noted today.  Vascular: Palpable pedal pulses bilaterally. No edema or erythema noted. Capillary refill within normal limits.  Neurological: Light touch and protective threshold diminished bilaterally.   Musculoskeletal Exam: Range of motion within normal limits to all pedal and ankle joints bilateral. Muscle strength 5/5 in all groups bilateral.  No prior amputations  Assessment: 1.  Ulcer plantar aspect of the fifth MTP right secondary to diabetes mellitus; healed 2.  Preulcerative callus lesion plantar aspect of the fifth MTP right  3.  Diabetes mellitus w/ peripheral neuropathy   Plan of Care:  -Patient was evaluated. - Light debridement of the preulcerative callus lesion was performed today using a 312 scalpel without incident or bleeding -Continue diabetic shoes and custom molded Plastizote insoles with the offloading felt dancers pad to offload pressure from the fifth MTP -Return to clinic 3 months   Felecia Shelling, DPM [facility]  Dr. Felecia Shelling, DPM    [address] [place], [place] [address]                Office [phone]  Fax [phone]

## 2023-02-07 ENCOUNTER — Other Ambulatory Visit: Payer: Self-pay | Admitting: Internal Medicine

## 2023-02-07 DIAGNOSIS — E118 Type 2 diabetes mellitus with unspecified complications: Secondary | ICD-10-CM

## 2023-02-07 NOTE — Telephone Encounter (Signed)
Requested Prescriptions  Pending Prescriptions Disp Refills   metFORMIN (GLUCOPHAGE-XR) 500 MG 24 hr tablet [Pharmacy Med Name: METFORMIN HCL ER 500 MG TABLET] 270 tablet 0    Sig: TAKE 3 TABLETS (1,500 MG TOTAL) BY MOUTH DAILY BEFORE SUPPER.     Endocrinology:  Diabetes - Biguanides Failed - 02/07/2023  1:37 AM      Failed - B12 Level in normal range and within 720 days    No results found for: "VITAMINB12"       Passed - Cr in normal range and within 360 days    Creatinine, Ser  Date Value Ref Range Status  12/13/2022 0.89 0.57 - 1.00 mg/dL Final         Passed - HBA1C is between 0 and 7.9 and within 180 days    Hemoglobin A1C  Date Value Ref Range Status  12/13/2022 6.6 (A) 4.0 - 5.6 % Final   Hgb A1c MFr Bld  Date Value Ref Range Status  06/03/2022 7.9 (H) 4.8 - 5.6 % Final    Comment:             Prediabetes: 5.7 - 6.4          Diabetes: >6.4          Glycemic control for adults with diabetes: <7.0          Passed - eGFR in normal range and within 360 days    eGFR  Date Value Ref Range Status  12/13/2022 79 >59 mL/min/1.73 Final         Passed - Valid encounter within last 6 months    Recent Outpatient Visits           1 month ago Annual physical exam   [place] [facility] at [facility], Sally Cowden, MD   4 months ago Type II diabetes mellitus with complication [facility])   [place] [facility] at [facility], Sally Cowden, MD   8 months ago Type II diabetes mellitus with complication [facility])   [place] [facility] at [facility], Sally Cowden, MD   1 year ago Annual physical exam   [facility] at [facility], Sally Cowden, MD   1 year ago Type II diabetes mellitus with complication [facility])   [place] [facility] at [facility], Sally Cowden, MD       Future Appointments             In 2 months  Judithann Graves Sally Cowden, MD [facility] at [facility]            Passed - CBC within normal limits and completed in the last 12 months    WBC  Date Value Ref Range Status  12/13/2022 9.7 3.4 - 10.8 x10E3/uL Final   RBC  Date Value Ref Range Status  12/13/2022 4.74 3.77 - 5.28 x10E6/uL Final   Hemoglobin  Date Value Ref Range Status  12/13/2022 14.3 11.1 - 15.9 g/dL Final   Hematocrit  Date Value Ref Range Status  12/13/2022 44.5 34.0 - 46.6 % Final   MCHC  Date Value Ref Range Status  12/13/2022 32.1 31.5 - 35.7 g/dL Final   [facility]  Date Value Ref Range Status  12/13/2022 30.2 26.6 - 33.0 pg Final   MCV  Date Value Ref Range Status  12/13/2022 94  79 - 97 fL Final   No results found for: "PLTCOUNTKUC", "LABPLAT", "POCPLA" RDW  Date Value Ref Range Status  12/13/2022 13.1 11.7 - 15.4 % Final

## 2023-04-01 ENCOUNTER — Ambulatory Visit: Payer: BC Managed Care – PPO | Admitting: Podiatry

## 2023-04-01 ENCOUNTER — Encounter: Payer: Self-pay | Admitting: Podiatry

## 2023-04-01 VITALS — Ht 69.0 in | Wt 190.0 lb

## 2023-04-01 DIAGNOSIS — E0843 Diabetes mellitus due to underlying condition with diabetic autonomic (poly)neuropathy: Secondary | ICD-10-CM

## 2023-04-01 DIAGNOSIS — L97512 Non-pressure chronic ulcer of other part of right foot with fat layer exposed: Secondary | ICD-10-CM | POA: Diagnosis not present

## 2023-04-01 DIAGNOSIS — Q667 Congenital pes cavus, unspecified foot: Secondary | ICD-10-CM

## 2023-04-01 LAB — HM DIABETES FOOT EXAM

## 2023-04-01 NOTE — Progress Notes (Signed)
   Chief Complaint  Patient presents with   Callouses    Patient is here for F/U callouses right foot    Subjective:  53 y.o. female with PMHx of diabetes mellitus, last A1c 12/13/2022 was 6.6, presenting for follow-up evaluation of a symptomatic callus to the right forefoot plantar fifth MTP.  She does not go barefoot around the house.  She wears her diabetic shoes with Plastizote insoles daily.  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  Dermatology:  Hyperkeratotic preulcerative callus lesion noted to the plantar aspect of the fifth MTP of the right foot.  Thick callus noted.  After debridement there is a superficial partial-thickness wound but it does not extend into subcutaneous tissue  Vascular: Palpable pedal pulses bilaterally. No edema or erythema noted. Capillary refill within normal limits.  Neurological: Light touch and protective threshold diminished bilaterally.   Musculoskeletal Exam: High arches noted to the right foot with excessive lateral column loading.  Assessment: 1.  Ulcer plantar aspect of the fifth MTP right secondary to diabetes mellitus 2.  Preulcerative callus lesion plantar aspect of the fifth MTP right  3.  Diabetes mellitus w/ peripheral neuropathy 4.  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 - Medically necessary excisional debridement including subcutaneous tissue was performed today using a 312 scalpel.  Excisional debridement of the necrotic nonviable callus tissue down to healthier tissue was performed with postdebridement measurement same as pre- -Continue diabetic shoes and  custom molded Plastizote insoles with the offloading felt dancers pad to offload pressure from the fifth MTP.  Offloading felt dancers pads were adjusted and reapplied today -Return to clinic 3 months   Thresa EMERSON Sar, DPM [facility]  Dr. Thresa EMERSON Sar, DPM    [address] [place], [place] [address]                Office [phone]  Fax [phone]

## 2023-04-28 ENCOUNTER — Encounter: Payer: Self-pay | Admitting: Internal Medicine

## 2023-04-28 ENCOUNTER — Ambulatory Visit: Payer: BC Managed Care – PPO | Admitting: Internal Medicine

## 2023-04-28 VITALS — BP 108/68 | HR 80 | Ht 69.0 in | Wt 193.0 lb

## 2023-04-28 DIAGNOSIS — E785 Hyperlipidemia, unspecified: Secondary | ICD-10-CM

## 2023-04-28 DIAGNOSIS — E118 Type 2 diabetes mellitus with unspecified complications: Secondary | ICD-10-CM | POA: Diagnosis not present

## 2023-04-28 DIAGNOSIS — E1169 Type 2 diabetes mellitus with other specified complication: Secondary | ICD-10-CM | POA: Diagnosis not present

## 2023-04-28 DIAGNOSIS — Z7984 Long term (current) use of oral hypoglycemic drugs: Secondary | ICD-10-CM

## 2023-04-28 DIAGNOSIS — Z1231 Encounter for screening mammogram for malignant neoplasm of breast: Secondary | ICD-10-CM

## 2023-04-28 DIAGNOSIS — Z1211 Encounter for screening for malignant neoplasm of colon: Secondary | ICD-10-CM

## 2023-04-28 LAB — POCT GLYCOSYLATED HEMOGLOBIN (HGB A1C): Hemoglobin A1C: 6.5 % — AB (ref 4.0–5.6)

## 2023-04-28 NOTE — Assessment & Plan Note (Signed)
 Blood sugars stable without hypoglycemic symptoms or events. Currently managed with metformin  and Farxiga . Changes made last visit are none. Lab Results  Component Value Date   HGBA1C 6.6 (A) 12/13/2022  A1C today = 6.5

## 2023-04-28 NOTE — Progress Notes (Signed)
 Date:  04/28/2023   Name:  Sally Graham   DOB:  1970-07-24   MRN:  [id number]   Chief Complaint: Diabetes  Diabetes She presents for her follow-up diabetic visit. She has type 2 diabetes mellitus. Her disease course has been stable. Pertinent negatives for hypoglycemia include no headaches or tremors. Pertinent negatives for diabetes include no chest pain, no fatigue, no polydipsia and no polyuria. Current diabetic treatment includes oral agent (dual therapy) (MTF and Farxiga ). An ACE inhibitor/angiotensin II receptor blocker is being taken.    Review of Systems  Constitutional:  Negative for appetite change, fatigue, fever and unexpected weight change.  HENT:  Negative for tinnitus and trouble swallowing.   Eyes:  Negative for visual disturbance.  Respiratory:  Negative for cough, chest tightness and shortness of breath.   Cardiovascular:  Negative for chest pain, palpitations and leg swelling.  Gastrointestinal:  Negative for abdominal pain.  Endocrine: Negative for polydipsia and polyuria.  Genitourinary:  Negative for dysuria and hematuria.  Musculoskeletal:  Negative for arthralgias.  Neurological:  Negative for tremors, numbness and headaches.  Psychiatric/Behavioral:  Negative for dysphoric mood.      Lab Results  Component Value Date   NA 140 12/13/2022   K 5.1 12/13/2022   CO2 23 12/13/2022   GLUCOSE 137 (H) 12/13/2022   BUN 19 12/13/2022   CREATININE 0.89 12/13/2022   CALCIUM  10.3 (H) 12/13/2022   EGFR 79 12/13/2022   Lab Results  Component Value Date   CHOL 140 12/13/2022   HDL 41 12/13/2022   LDLCALC 69 12/13/2022   TRIG 179 (H) 12/13/2022   CHOLHDL 3.4 12/13/2022   Lab Results  Component Value Date   TSH 2.360 12/13/2022   Lab Results  Component Value Date   HGBA1C 6.5 (A) 04/28/2023   Lab Results  Component Value Date   WBC 9.7 12/13/2022   HGB 14.3 12/13/2022   HCT 44.5 12/13/2022   MCV 94 12/13/2022   PLT 396 12/13/2022   Lab Results   Component Value Date   ALT 13 12/13/2022   AST 17 12/13/2022   ALKPHOS 88 12/13/2022   BILITOT 0.8 12/13/2022   No results found for: "25OHVITD2", "25OHVITD3", "VD25OH"   Patient Active Problem List   Diagnosis Date Noted   Hyperlipidemia associated with type 2 diabetes mellitus (HCC) 06/03/2022   Microalbuminuria due to type 2 diabetes mellitus (HCC) 07/11/2021   Type II diabetes mellitus with complication (HCC) 04/17/2021    No Known Allergies  Past Surgical History:  Procedure Laterality Date   CESAREAN SECTION  2000    Social History   Tobacco Use   Smoking status: Every Day    Current packs/day: 0.50    Average packs/day: 0.5 packs/day for 15.0 years (7.5 ttl pk-yrs)    Types: Cigarettes   Smokeless tobacco: Never  Vaping Use   Vaping status: Never Used  Substance Use Topics   Alcohol use: Not Currently    Alcohol/week: 3.0 - 4.0 standard drinks of alcohol    Types: 3 - 4 Glasses of wine per week    Comment: occassionally   Drug use: Never     Medication list has been reviewed and updated.  Current Meds  Medication Sig   ACCU-CHEK GUIDE test strip USE TO CHECK BLOOD SUGAR UP TO 4 TIMES DAILY   Accu-Chek Softclix Lancets lancets TEST UP TO FOUR TIMES DAILY   CINNAMON PO Take by mouth.   FARXIGA  10 MG TABS tablet TAKE  1 TABLET BY MOUTH DAILY BEFORE BREAKFAST.   lisinopril  (ZESTRIL ) 5 MG tablet TAKE 1 TABLET (5 MG TOTAL) BY MOUTH DAILY.   metFORMIN  (GLUCOPHAGE -XR) 500 MG 24 hr tablet TAKE 3 TABLETS (1,500 MG TOTAL) BY MOUTH DAILY BEFORE SUPPER.   Multiple Vitamins-Minerals (MULTIVIT/MULTIMINERAL ADULT PO) Take by mouth.   Omega-3 Fatty Acids (FISH OIL) 1000 MG CAPS Take by mouth.   rosuvastatin  (CRESTOR ) 5 MG tablet TAKE 1 TABLET (5 MG TOTAL) BY MOUTH DAILY.       04/28/2023    9:36 AM 12/13/2022    8:36 AM 09/17/2022   10:44 AM 06/03/2022    8:26 AM  GAD 7 : Generalized Anxiety Score  Nervous, Anxious, on Edge 0 0 0 0  Control/stop worrying 0 0 0 0   Worry too much - different things 0 0 0 0  Trouble relaxing 0 0 0 0  Restless 0 0 0 0  Easily annoyed or irritable 0 0 0 0  Afraid - awful might happen 0 0 0 0  Total GAD 7 Score 0 0 0 0  Anxiety Difficulty Not difficult at all Not difficult at all Not difficult at all Not difficult at all       04/28/2023    9:36 AM 12/13/2022    8:36 AM 09/17/2022   10:44 AM  Depression screen PHQ 2/9  Decreased Interest 0 0 0  Down, Depressed, Hopeless 0 0 0  PHQ - 2 Score 0 0 0  Altered sleeping 0 0 0  Tired, decreased energy 0 0 0  Change in appetite 0 0 0  Feeling bad or failure about yourself  0 0 0  Trouble concentrating 0 0 0  Moving slowly or fidgety/restless 0 0 0  Suicidal thoughts 0 0 0  PHQ-9 Score 0 0 0  Difficult doing work/chores Not difficult at all Not difficult at all Not difficult at all    BP Readings from Last 3 Encounters:  04/28/23 108/68  12/31/22 118/74  12/13/22 112/64    Physical Exam Vitals and nursing note reviewed.  Constitutional:      General: She is not in acute distress.    Appearance: She is well-developed.  HENT:     Head: Normocephalic and atraumatic.  Neck:     Vascular: No carotid bruit.  Cardiovascular:     Rate and Rhythm: Normal rate and regular rhythm.  Pulmonary:     Effort: Pulmonary effort is normal. No respiratory distress.     Breath sounds: No wheezing or rhonchi.  Musculoskeletal:     Cervical back: Normal range of motion.     Right lower leg: No edema.     Left lower leg: No edema.  Lymphadenopathy:     Cervical: No cervical adenopathy.  Skin:    General: Skin is warm and dry.     Findings: No rash.  Neurological:     Mental Status: She is alert and oriented to person, place, and time.  Psychiatric:        Mood and Affect: Mood normal.        Behavior: Behavior normal.     Wt Readings from Last 3 Encounters:  04/28/23 193 lb (87.5 kg)  04/01/23 190 lb (86.2 kg)  12/13/22 190 lb 6.4 oz (86.4 kg)    BP 108/68    Pulse 80   Ht 5\' 9"  (1.753 m)   Wt 193 lb (87.5 kg)   SpO2 99%   BMI 28.50 kg/m  Assessment and Plan:  Problem List Items Addressed This Visit       Unprioritized   Hyperlipidemia associated with type 2 diabetes mellitus (HCC) (Chronic)   Type II diabetes mellitus with complication (HCC) - Primary (Chronic)   Blood sugars stable without hypoglycemic symptoms or events. Currently managed with metformin  and Farxiga . Changes made last visit are none. Lab Results  Component Value Date   HGBA1C 6.6 (A) 12/13/2022  A1C today = 6.5      Relevant Orders   POCT glycosylated hemoglobin (Hb A1C) (Completed)   Other Visit Diagnoses       Long term current use of oral hypoglycemic drug         Colon cancer screening       Relevant Orders   Cologuard     Encounter for screening mammogram for breast cancer       mammogram has been ordered Patient reminded to schedule       Return in about 4 months (around 08/26/2023) for DM.    Sheron Dixons, MD [facility] Primary Care and Sports Medicine [place]

## 2023-04-28 NOTE — Patient Instructions (Addendum)
 Call [facility] to schedule your mammogram at [phone].   Schedule eye exam with [facility].

## 2023-05-06 ENCOUNTER — Other Ambulatory Visit: Payer: Self-pay | Admitting: Internal Medicine

## 2023-05-06 DIAGNOSIS — E118 Type 2 diabetes mellitus with unspecified complications: Secondary | ICD-10-CM

## 2023-05-06 NOTE — Telephone Encounter (Signed)
 Requested Prescriptions  Pending Prescriptions Disp Refills   metFORMIN (GLUCOPHAGE-XR) 500 MG 24 hr tablet [Pharmacy Med Name: METFORMIN HCL ER 500 MG TABLET] 270 tablet 0    Sig: TAKE 3 TABLETS (1,500 MG TOTAL) BY MOUTH DAILY BEFORE SUPPER.     Endocrinology:  Diabetes - Biguanides Failed - 05/06/2023  3:54 PM      Failed - B12 Level in normal range and within 720 days    No results found for: "VITAMINB12"       Passed - Cr in normal range and within 360 days    Creatinine, Ser  Date Value Ref Range Status  12/13/2022 0.89 0.57 - 1.00 mg/dL Final         Passed - HBA1C is between 0 and 7.9 and within 180 days    Hemoglobin A1C  Date Value Ref Range Status  04/28/2023 6.5 (A) 4.0 - 5.6 % Final   Hgb A1c MFr Bld  Date Value Ref Range Status  06/03/2022 7.9 (H) 4.8 - 5.6 % Final    Comment:             Prediabetes: 5.7 - 6.4          Diabetes: >6.4          Glycemic control for adults with diabetes: <7.0          Passed - eGFR in normal range and within 360 days    eGFR  Date Value Ref Range Status  12/13/2022 79 >59 mL/min/1.73 Final         Passed - Valid encounter within last 6 months    Recent Outpatient Visits           4 months ago Annual physical exam   [place] [facility] Medicine at [facility], Nyoka Cowden, MD   7 months ago Type II diabetes mellitus with complication [facility])   [place] Primary Care & Sports Medicine at [facility], Nyoka Cowden, MD   11 months ago Type II diabetes mellitus with complication [facility])   [place] Primary Care & Sports Medicine at [facility], Nyoka Cowden, MD   1 year ago Annual physical exam   [facility] at [facility], Nyoka Cowden, MD   1 year ago Type II diabetes mellitus with complication [facility])   [place] [facility] Care & Sports Medicine at [facility], Nyoka Cowden, MD       Future Appointments             In 3 months  Judithann Graves Nyoka Cowden, MD [facility] Primary Care & Sports Medicine at [facility]            Passed - CBC within normal limits and completed in the last 12 months    WBC  Date Value Ref Range Status  12/13/2022 9.7 3.4 - 10.8 x10E3/uL Final   RBC  Date Value Ref Range Status  12/13/2022 4.74 3.77 - 5.28 x10E6/uL Final   Hemoglobin  Date Value Ref Range Status  12/13/2022 14.3 11.1 - 15.9 g/dL Final   Hematocrit  Date Value Ref Range Status  12/13/2022 44.5 34.0 - 46.6 % Final   MCHC  Date Value Ref Range Status  12/13/2022 32.1 31.5 - 35.7 g/dL Final   [facility]  Date Value Ref Range Status  12/13/2022 30.2 26.6 - 33.0 pg Final   MCV  Date Value Ref Range Status  12/13/2022 94  79 - 97 fL Final   No results found for: "PLTCOUNTKUC", "LABPLAT", "POCPLA" RDW  Date Value Ref Range Status  12/13/2022 13.1 11.7 - 15.4 % Final

## 2023-05-22 ENCOUNTER — Other Ambulatory Visit: Payer: Self-pay | Admitting: Internal Medicine

## 2023-05-22 DIAGNOSIS — E785 Hyperlipidemia, unspecified: Secondary | ICD-10-CM

## 2023-05-22 DIAGNOSIS — E1129 Type 2 diabetes mellitus with other diabetic kidney complication: Secondary | ICD-10-CM

## 2023-05-22 NOTE — Telephone Encounter (Signed)
 Requested Prescriptions  Pending Prescriptions Disp Refills   rosuvastatin (CRESTOR) 5 MG tablet [Pharmacy Med Name: ROSUVASTATIN CALCIUM 5 MG TAB] 90 tablet 1    Sig: TAKE 1 TABLET (5 MG TOTAL) BY MOUTH DAILY.     Cardiovascular:  Antilipid - Statins 2 Failed - 05/22/2023  3:21 PM      Failed - Lipid Panel in normal range within the last 12 months    Cholesterol, Total  Date Value Ref Range Status  12/13/2022 140 100 - 199 mg/dL Final   LDL Chol Calc (NIH)  Date Value Ref Range Status  12/13/2022 69 0 - 99 mg/dL Final   HDL  Date Value Ref Range Status  12/13/2022 41 >39 mg/dL Final   Triglycerides  Date Value Ref Range Status  12/13/2022 179 (H) 0 - 149 mg/dL Final         Passed - Cr in normal range and within 360 days    Creatinine, Ser  Date Value Ref Range Status  12/13/2022 0.89 0.57 - 1.00 mg/dL Final         Passed - Patient is not pregnant      Passed - Valid encounter within last 12 months    Recent Outpatient Visits           5 months ago Annual physical exam   [place] [facility] Medicine at [facility], Nyoka Cowden, MD   8 months ago Type II diabetes mellitus with complication [facility])   [place] [facility] Medicine at [facility], Nyoka Cowden, MD   11 months ago Type II diabetes mellitus with complication [facility])   [place] Primary Care & Sports Medicine at [facility], Nyoka Cowden, MD   1 year ago Annual physical exam   [facility] Primary Care & Sports Medicine at [facility], Nyoka Cowden, MD   1 year ago Type II diabetes mellitus with complication [facility])   [place] Primary Care & Sports Medicine at [facility], Nyoka Cowden, MD       Future Appointments             In 3 months Judithann Graves Nyoka Cowden, MD [facility] Primary Care & Sports Medicine at [facility] [place], [facility]             lisinopril (ZESTRIL) 5 MG tablet [Pharmacy Med Name: LISINOPRIL 5 MG TABLET] 90 tablet 1     Sig: TAKE 1 TABLET (5 MG TOTAL) BY MOUTH DAILY.     Cardiovascular:  ACE Inhibitors Passed - 05/22/2023  3:21 PM      Passed - Cr in normal range and within 180 days    Creatinine, Ser  Date Value Ref Range Status  12/13/2022 0.89 0.57 - 1.00 mg/dL Final         Passed - K in normal range and within 180 days    Potassium  Date Value Ref Range Status  12/13/2022 5.1 3.5 - 5.2 mmol/L Final         Passed - Patient is not pregnant      Passed - Last BP in normal range    BP Readings from Last 1 Encounters:  04/28/23 108/68         Passed - Valid encounter within last 6 months    Recent Outpatient Visits           5 months ago Annual physical exam   [facility] at [facility], Nyoka Cowden, MD   8 months ago Type II diabetes mellitus with complication [facility])   [place] [facility] Care [facility] Sports Medicine at [facility], Nyoka Cowden, MD   11 months ago Type II diabetes mellitus with complication [facility])   [place] Primary Care & Sports Medicine at [facility], Nyoka Cowden, MD   1 year ago Annual physical exam   [facility] at [facility], Nyoka Cowden, MD   1 year ago Type II diabetes mellitus with complication [facility])   [place] Primary Care & Sports Medicine at [facility], Nyoka Cowden, MD       Future Appointments             In 3 months Judithann Graves, Nyoka Cowden, MD [facility] Primary Care & [facility] Medicine at [facility], [facility]

## 2023-05-23 IMAGING — CR DG FOOT COMPLETE 3+V*R*
3 series · 3 of 3 positions shown · non-contrast
Comparison: None.

CLINICAL DATA: Nonhealing diabetic foot ulcer. Ulcer is at the
lateral aspect the foot near the proximal fifth phalanx.

EXAM:
RIGHT FOOT COMPLETE - 3+ VIEW

[foot ap]
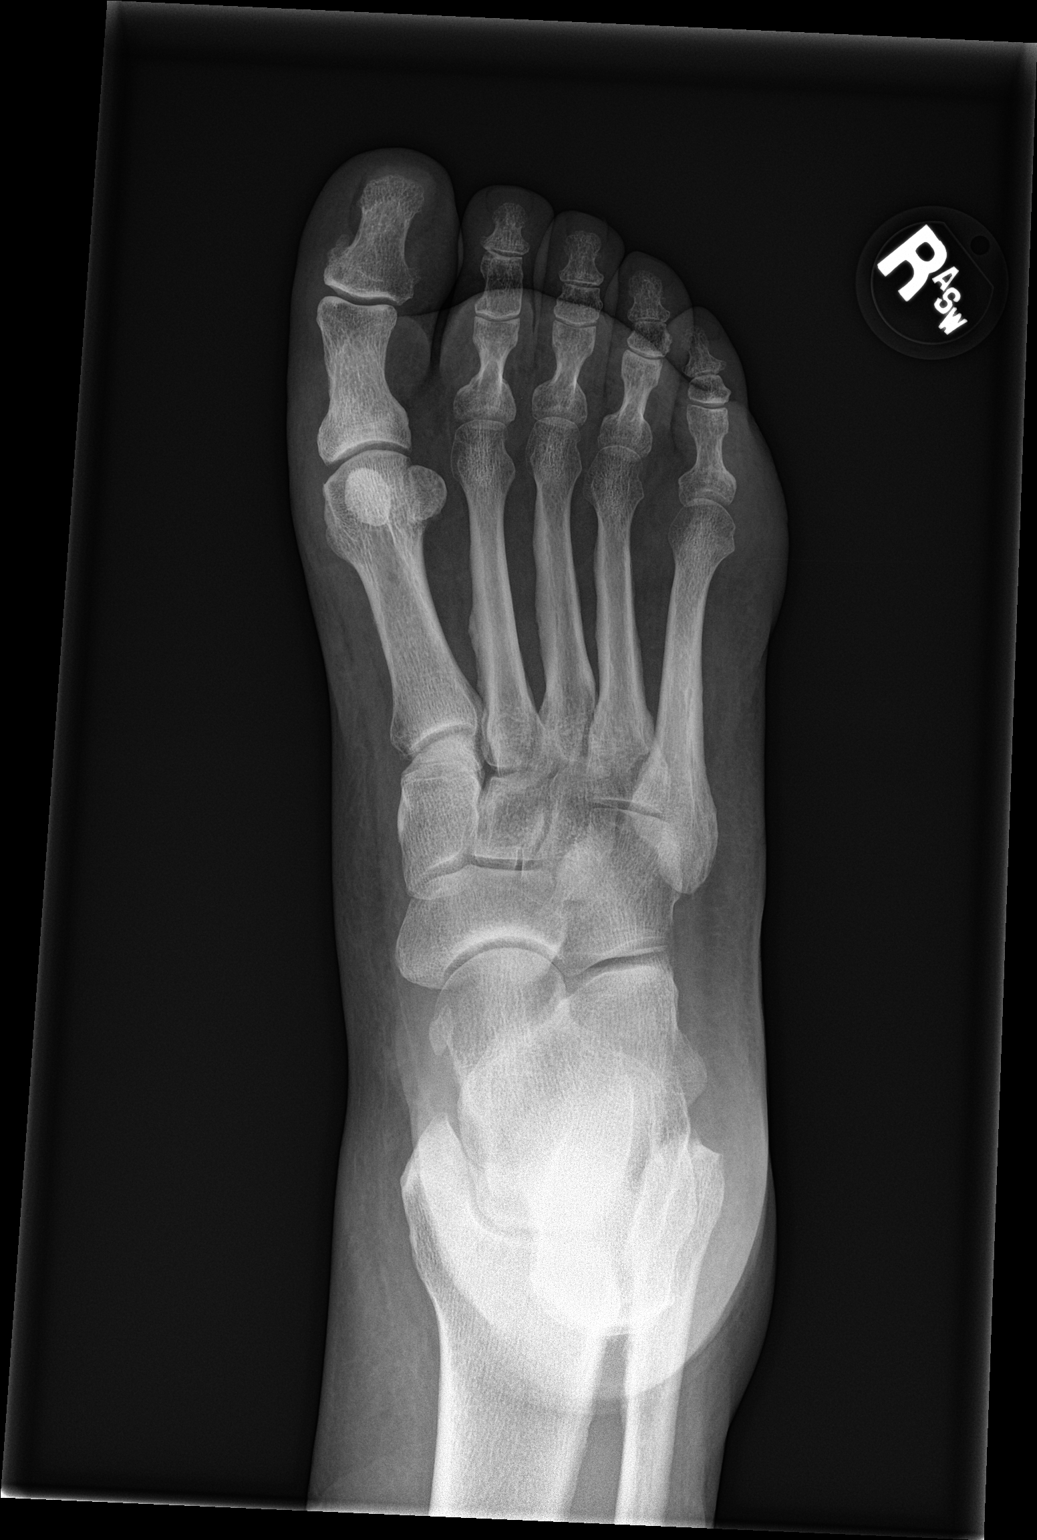

[foot obl]
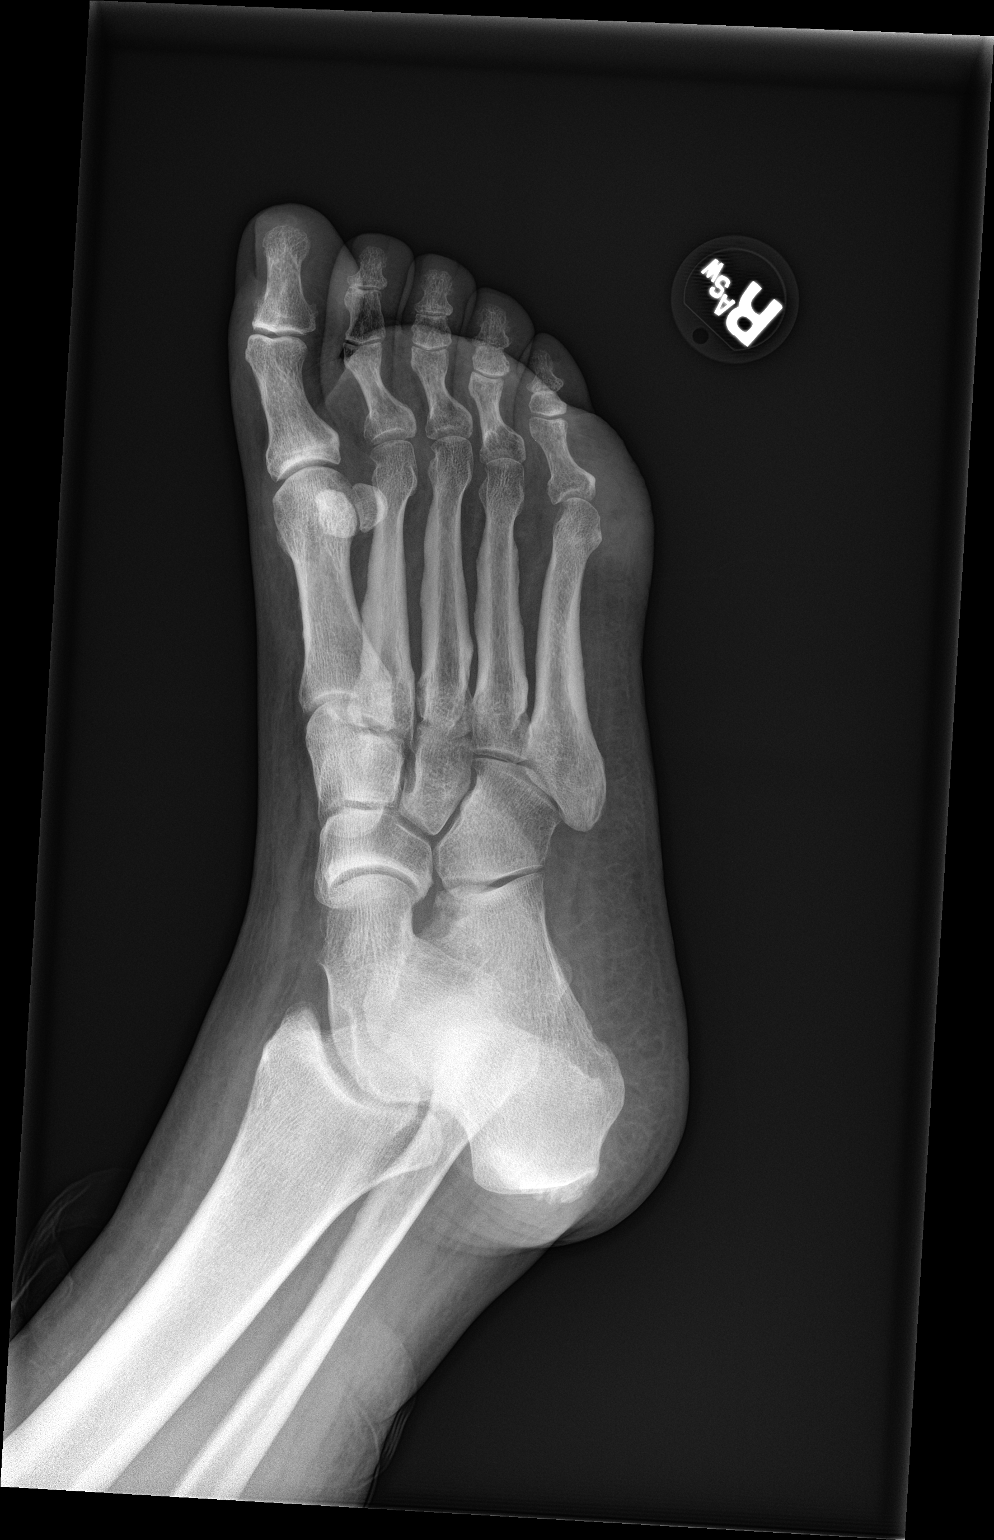

[foot lat]
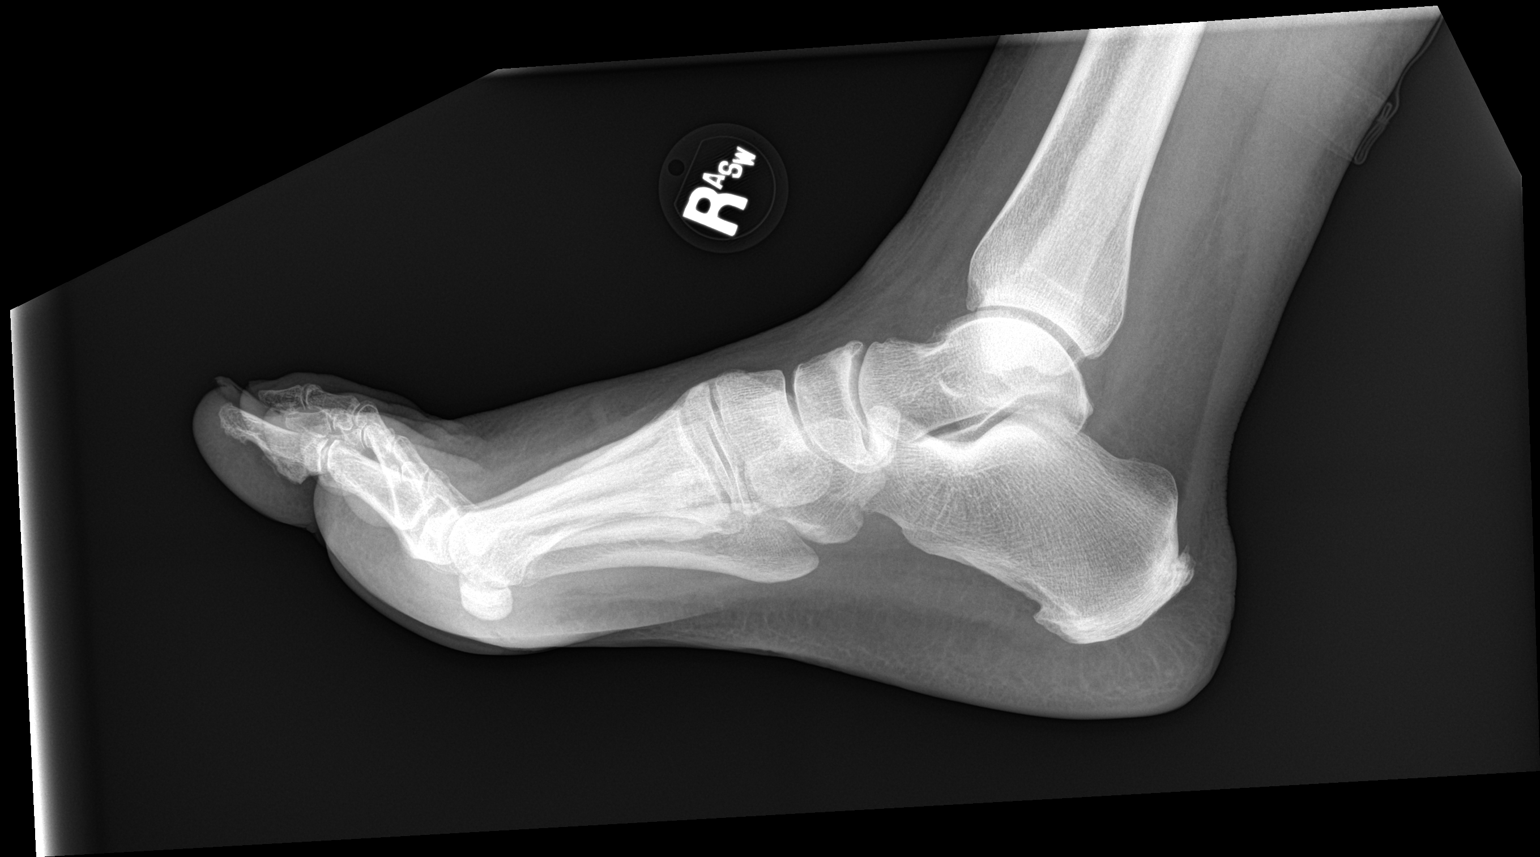

[3 of 3 positions shown; findings below may reference images not displayed]

FINDINGS: There is no evidence of fracture or dislocation. There is no
evidence of arthropathy or other focal bone abnormality. No erosive
lesion. There is soft tissue swelling at the base of the fifth toe.
IMPRESSION: Soft tissue swelling at the base of the fifth toe. No adjacent bony
abnormality.

## 2023-06-03 ENCOUNTER — Ambulatory Visit: Payer: BC Managed Care – PPO | Admitting: Podiatry

## 2023-06-06 ENCOUNTER — Ambulatory Visit: Payer: BC Managed Care – PPO | Admitting: Podiatry

## 2023-06-12 ENCOUNTER — Other Ambulatory Visit: Payer: Self-pay | Admitting: Internal Medicine

## 2023-06-12 DIAGNOSIS — E118 Type 2 diabetes mellitus with unspecified complications: Secondary | ICD-10-CM

## 2023-06-13 NOTE — Telephone Encounter (Signed)
 Requested Prescriptions  Pending Prescriptions Disp Refills   FARXIGA 10 MG TABS tablet [Pharmacy Med Name: FARXIGA 10 MG TABLET] 90 tablet 1    Sig: TAKE 1 TABLET BY MOUTH EVERY DAY BEFORE BREAKFAST     Endocrinology:  Diabetes - SGLT2 Inhibitors Passed - 06/13/2023 12:58 PM      Passed - Cr in normal range and within 360 days    Creatinine, Ser  Date Value Ref Range Status  12/13/2022 0.89 0.57 - 1.00 mg/dL Final         Passed - HBA1C is between 0 and 7.9 and within 180 days    Hemoglobin A1C  Date Value Ref Range Status  04/28/2023 6.5 (A) 4.0 - 5.6 % Final   Hgb A1c MFr Bld  Date Value Ref Range Status  06/03/2022 7.9 (H) 4.8 - 5.6 % Final    Comment:             Prediabetes: 5.7 - 6.4          Diabetes: >6.4          Glycemic control for adults with diabetes: <7.0          Passed - eGFR in normal range and within 360 days    eGFR  Date Value Ref Range Status  12/13/2022 79 >59 mL/min/1.73 Final         Passed - Valid encounter within last 6 months    Recent Outpatient Visits           1 month ago Type II diabetes mellitus with complication [facility])   [place] Primary Care & Sports Medicine at [facility], Nyoka Cowden, MD       Future Appointments             In 2 months Judithann Graves, Nyoka Cowden, MD [facility] Primary Care & Sports Medicine at [facility], [facility]

## 2023-07-15 ENCOUNTER — Encounter: Payer: Self-pay | Admitting: Podiatry

## 2023-07-15 ENCOUNTER — Ambulatory Visit: Payer: BC Managed Care – PPO | Admitting: Podiatry

## 2023-07-15 DIAGNOSIS — L97512 Non-pressure chronic ulcer of other part of right foot with fat layer exposed: Secondary | ICD-10-CM | POA: Diagnosis not present

## 2023-07-15 NOTE — Progress Notes (Signed)
   Chief Complaint  Patient presents with   Diabetic Ulcer    "It's doing alright, I'm here during the painful part."    Subjective:  53 y.o. female with PMHx of diabetes mellitus, last A1c 12/13/2022 was 6.6, presenting for follow-up evaluation of a symptomatic callus to the right forefoot plantar fifth MTP.  She does not go barefoot around the house.  She wears her diabetic shoes with Plastizote insoles daily with offloading felt padding.  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  Dermatology:  There continues to be chronic hyperkeratotic preulcerative callus lesion noted to the plantar aspect of the fifth MTP of the right foot.  Thick callus noted with dried sanguinous debris within the callus.  Again after debridement there is a superficial partial-thickness wound but it does not extend into subcutaneous tissue  Vascular: Palpable pedal pulses bilaterally. No edema or erythema noted. Capillary refill within normal limits.  Neurological: Light touch and protective threshold diminished bilaterally.   Musculoskeletal Exam: High arches noted to the right foot with excessive lateral column loading.  Assessment: 1.  Ulcer plantar aspect of the fifth MTP right secondary to diabetes mellitus 2.  Preulcerative callus lesion plantar aspect of the fifth MTP right  3.  Diabetes mellitus w/ peripheral neuropathy 4.  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 - Excisional debridement of the preulcerative callus was performed today using a 312 scalpel without incident or bleeding.  There is underlying partial thickness wound  noted limited to the dermal layers. -Continue diabetic shoes and insoles with offloading felt padding -Today we had a long discussion regarding the chronic nature of her preulcerative callus which has been ongoing for a few years now despite conservative care.  I do believe it is appropriate at this time to discuss tailor's bunionectomy type surgery to alleviate pressure from the plantar aspect of the fifth MTP and plantarflexed fifth metatarsal head.  The procedure was explained in detail to the patient including risk benefits advantages disadvantages as well as the postoperative recovery course.  She will think about this and we will discuss further next visit -Return to clinic 3 months   Dot Gazella, DPM [facility]  Dr. Dot Gazella, DPM    [address] [place], [place] [address]                Office [phone]  Fax [phone]

## 2023-07-21 LAB — COLOGUARD: COLOGUARD: NEGATIVE

## 2023-07-22 ENCOUNTER — Encounter: Payer: Self-pay | Admitting: Internal Medicine

## 2023-08-03 ENCOUNTER — Other Ambulatory Visit: Payer: Self-pay | Admitting: Internal Medicine

## 2023-08-03 DIAGNOSIS — E118 Type 2 diabetes mellitus with unspecified complications: Secondary | ICD-10-CM

## 2023-08-05 NOTE — Telephone Encounter (Signed)
 Requested Prescriptions  Pending Prescriptions Disp Refills   metFORMIN  (GLUCOPHAGE -XR) 500 MG 24 hr tablet [Pharmacy Med Name: METFORMIN  HCL ER 500 MG TABLET] 270 tablet 0    Sig: TAKE 3 TABLETS (1,500 MG TOTAL) BY MOUTH DAILY BEFORE SUPPER.     Endocrinology:  Diabetes - Biguanides Failed - 08/05/2023  4:12 PM      Failed - B12 Level in normal range and within 720 days    No results found for: "VITAMINB12"       Passed - Cr in normal range and within 360 days    Creatinine, Ser  Date Value Ref Range Status  12/13/2022 0.89 0.57 - 1.00 mg/dL Final         Passed - HBA1C is between 0 and 7.9 and within 180 days    Hemoglobin A1C  Date Value Ref Range Status  04/28/2023 6.5 (A) 4.0 - 5.6 % Final   Hgb A1c MFr Bld  Date Value Ref Range Status  06/03/2022 7.9 (H) 4.8 - 5.6 % Final    Comment:             Prediabetes: 5.7 - 6.4          Diabetes: >6.4          Glycemic control for adults with diabetes: <7.0          Passed - eGFR in normal range and within 360 days    eGFR  Date Value Ref Range Status  12/13/2022 79 >59 mL/min/1.73 Final         Passed - Valid encounter within last 6 months    Recent Outpatient Visits           3 months ago Type II diabetes mellitus with complication [facility])    Primary Care & Sports Medicine at [facility], Chales [place], MD       Future Appointments             In 3 weeks Gala Jubilee [place], MD [facility] Primary Care & Sports Medicine at [facility]            Passed - CBC within normal limits and completed in the last 12 months    WBC  Date Value Ref Range Status  12/13/2022 9.7 3.4 - 10.8 x10E3/uL Final   RBC  Date Value Ref Range Status  12/13/2022 4.74 3.77 - 5.28 x10E6/uL Final   Hemoglobin  Date Value Ref Range Status  12/13/2022 14.3 11.1 - 15.9 g/dL Final   Hematocrit  Date Value Ref Range Status  12/13/2022 44.5 34.0 - 46.6 % Final   MCHC  Date Value Ref Range Status   12/13/2022 32.1 31.5 - 35.7 g/dL Final   [facility]  Date Value Ref Range Status  12/13/2022 30.2 26.6 - 33.0 pg Final   MCV  Date Value Ref Range Status  12/13/2022 94 79 - 97 fL Final   No results found for: "PLTCOUNTKUC", "LABPLAT", "POCPLA" RDW  Date Value Ref Range Status  12/13/2022 13.1 11.7 - 15.4 % Final

## 2023-08-16 ENCOUNTER — Other Ambulatory Visit: Payer: Self-pay | Admitting: Internal Medicine

## 2023-08-16 DIAGNOSIS — E1129 Type 2 diabetes mellitus with other diabetic kidney complication: Secondary | ICD-10-CM

## 2023-08-28 ENCOUNTER — Encounter: Payer: Self-pay | Admitting: Internal Medicine

## 2023-08-28 ENCOUNTER — Ambulatory Visit: Payer: BC Managed Care – PPO | Admitting: Internal Medicine

## 2023-08-28 VITALS — BP 122/70 | HR 68 | Ht 69.0 in | Wt 197.2 lb

## 2023-08-28 DIAGNOSIS — E118 Type 2 diabetes mellitus with unspecified complications: Secondary | ICD-10-CM

## 2023-08-28 DIAGNOSIS — E1169 Type 2 diabetes mellitus with other specified complication: Secondary | ICD-10-CM

## 2023-08-28 DIAGNOSIS — E785 Hyperlipidemia, unspecified: Secondary | ICD-10-CM

## 2023-08-28 DIAGNOSIS — Z7984 Long term (current) use of oral hypoglycemic drugs: Secondary | ICD-10-CM

## 2023-08-28 DIAGNOSIS — Z1231 Encounter for screening mammogram for malignant neoplasm of breast: Secondary | ICD-10-CM

## 2023-08-28 LAB — POCT GLYCOSYLATED HEMOGLOBIN (HGB A1C): Hemoglobin A1C: 6.7 % — AB (ref 4.0–5.6)

## 2023-08-28 NOTE — Progress Notes (Signed)
 Date:  08/28/2023   Name:  Sally Graham   DOB:  1970-09-12   MRN:  [id number]   Chief Complaint: Diabetes  Diabetes She presents for her follow-up diabetic visit. She has type 2 diabetes mellitus. Her disease course has been stable. Pertinent negatives for hypoglycemia include no dizziness or headaches. Pertinent negatives for diabetes include no chest pain, no fatigue and no weakness. Current diabetic treatments: MTF and Farxiga .  Hyperlipidemia This is a chronic problem. The problem is controlled. Pertinent negatives include no chest pain, myalgias or shortness of breath. Current antihyperlipidemic treatment includes statins.    Review of Systems  Constitutional:  Negative for fatigue and unexpected weight change.  HENT:  Negative for trouble swallowing.   Eyes:  Negative for visual disturbance.  Respiratory:  Negative for cough, chest tightness, shortness of breath and wheezing.   Cardiovascular:  Negative for chest pain, palpitations and leg swelling.  Gastrointestinal:  Negative for abdominal pain, constipation and diarrhea.  Musculoskeletal:  Negative for arthralgias and myalgias.  Neurological:  Negative for dizziness, weakness, light-headedness and headaches.     Lab Results  Component Value Date   NA 140 12/13/2022   K 5.1 12/13/2022   CO2 23 12/13/2022   GLUCOSE 137 (H) 12/13/2022   BUN 19 12/13/2022   CREATININE 0.89 12/13/2022   CALCIUM  10.3 (H) 12/13/2022   EGFR 79 12/13/2022   Lab Results  Component Value Date   CHOL 140 12/13/2022   HDL 41 12/13/2022   LDLCALC 69 12/13/2022   TRIG 179 (H) 12/13/2022   CHOLHDL 3.4 12/13/2022   Lab Results  Component Value Date   TSH 2.360 12/13/2022   Lab Results  Component Value Date   HGBA1C 6.7 (A) 08/28/2023   Lab Results  Component Value Date   WBC 9.7 12/13/2022   HGB 14.3 12/13/2022   HCT 44.5 12/13/2022   MCV 94 12/13/2022   PLT 396 12/13/2022   Lab Results  Component Value Date   ALT 13  12/13/2022   AST 17 12/13/2022   ALKPHOS 88 12/13/2022   BILITOT 0.8 12/13/2022   No results found for: Lucetta Russel, VD25OH   Patient Active Problem List   Diagnosis Date Noted   Hyperlipidemia associated with type 2 diabetes mellitus (HCC) 06/03/2022   Microalbuminuria due to type 2 diabetes mellitus (HCC) 07/11/2021   Type II diabetes mellitus with complication (HCC) 04/17/2021    No Known Allergies  Past Surgical History:  Procedure Laterality Date   CESAREAN SECTION  2000    Social History   Tobacco Use   Smoking status: Every Day    Current packs/day: 0.50    Average packs/day: 0.5 packs/day for 15.0 years (7.5 ttl pk-yrs)    Types: Cigarettes   Smokeless tobacco: Never  Vaping Use   Vaping status: Never Used  Substance Use Topics   Alcohol use: Not Currently    Alcohol/week: 3.0 - 4.0 standard drinks of alcohol    Types: 3 - 4 Glasses of wine per week    Comment: occassionally   Drug use: Never     Medication list has been reviewed and updated.  Current Meds  Medication Sig   ACCU-CHEK GUIDE test strip USE TO CHECK BLOOD SUGAR UP TO 4 TIMES DAILY   Accu-Chek Softclix Lancets lancets TEST UP TO FOUR TIMES DAILY   CINNAMON PO Take by mouth.   dapagliflozin  propanediol (FARXIGA ) 10 MG TABS tablet TAKE 1 TABLET BY MOUTH EVERY DAY BEFORE BREAKFAST  lisinopril  (ZESTRIL ) 5 MG tablet TAKE 1 TABLET (5 MG TOTAL) BY MOUTH DAILY.   metFORMIN  (GLUCOPHAGE -XR) 500 MG 24 hr tablet TAKE 3 TABLETS (1,500 MG TOTAL) BY MOUTH DAILY BEFORE SUPPER.   Multiple Vitamins-Minerals (MULTIVIT/MULTIMINERAL ADULT PO) Take by mouth.   Omega-3 Fatty Acids (FISH OIL) 1000 MG CAPS Take by mouth.   rosuvastatin  (CRESTOR ) 5 MG tablet TAKE 1 TABLET (5 MG TOTAL) BY MOUTH DAILY.       08/28/2023   11:05 AM 04/28/2023    9:36 AM 12/13/2022    8:36 AM 09/17/2022   10:44 AM  GAD 7 : Generalized Anxiety Score  Nervous, Anxious, on Edge 0 0 0 0  Control/stop worrying 0 0 0 0   Worry too much - different things 0 0 0 0  Trouble relaxing 0 0 0 0  Restless 0 0 0 0  Easily annoyed or irritable 0 0 0 0  Afraid - awful might happen 0 0 0 0  Total GAD 7 Score 0 0 0 0  Anxiety Difficulty Not difficult at all Not difficult at all Not difficult at all Not difficult at all       08/28/2023   11:05 AM 04/28/2023    9:36 AM 12/13/2022    8:36 AM  Depression screen PHQ 2/9  Decreased Interest 0 0 0  Down, Depressed, Hopeless 0 0 0  PHQ - 2 Score 0 0 0  Altered sleeping 0 0 0  Tired, decreased energy 0 0 0  Change in appetite 0 0 0  Feeling bad or failure about yourself  0 0 0  Trouble concentrating 0 0 0  Moving slowly or fidgety/restless 0 0 0  Suicidal thoughts 0 0 0  PHQ-9 Score 0 0 0  Difficult doing work/chores Not difficult at all Not difficult at all Not difficult at all    BP Readings from Last 3 Encounters:  08/28/23 122/70  04/28/23 108/68  12/31/22 118/74    Physical Exam Vitals and nursing note reviewed.  Constitutional:      General: She is not in acute distress.    Appearance: She is well-developed.  HENT:     Head: Normocephalic and atraumatic.  Neck:     Vascular: No carotid bruit.   Cardiovascular:     Rate and Rhythm: Normal rate and regular rhythm.     Heart sounds: No murmur heard. Pulmonary:     Effort: Pulmonary effort is normal. No respiratory distress.     Breath sounds: No wheezing or rhonchi.   Musculoskeletal:     Cervical back: Normal range of motion.     Right lower leg: No edema.     Left lower leg: No edema.  Lymphadenopathy:     Cervical: No cervical adenopathy.   Skin:    General: Skin is warm and dry.     Findings: No rash.   Neurological:     General: No focal deficit present.     Mental Status: She is alert and oriented to person, place, and time.   Psychiatric:        Mood and Affect: Mood normal.        Behavior: Behavior normal.     Wt Readings from Last 3 Encounters:  08/28/23 197 lb 4 oz  (89.5 kg)  04/28/23 193 lb (87.5 kg)  04/01/23 190 lb (86.2 kg)    BP 122/70   Pulse 68   Ht 5' 9 (1.753 m)   Wt 197 lb 4  oz (89.5 kg)   SpO2 98%   BMI 29.13 kg/m   Assessment and Plan:  Problem List Items Addressed This Visit       Unprioritized   Type II diabetes mellitus with complication (HCC) (Chronic)   Blood sugars have been stable.  No recent hypoglycemic events requiring assistance.  Fasting BS < 130, mostly < 120. Currently medications are MTF and Farxiga . Lab Results  Component Value Date   HGBA1C 6.5 (A) 04/28/2023   Last visit no changes were made. A1C today = 6.7       Relevant Orders   POCT glycosylated hemoglobin (Hb A1C) (Completed)   Hyperlipidemia associated with type 2 diabetes mellitus (HCC) (Chronic)   LDL is  Lab Results  Component Value Date   LDLCALC 69 12/13/2022   Current regimen is Crestor .  No medication side effects noted. Goal LDL is <70.       Other Visit Diagnoses       Long term current use of oral hypoglycemic drug    -  Primary     Encounter for screening mammogram for breast cancer       patient is reminded to schedule mammogram       Return in about 4 months (around 12/28/2023) for CPX.    Sheron Dixons, MD [facility] [place]

## 2023-08-28 NOTE — Patient Instructions (Signed)
 Fasting BS < 120  2 hours after eating BS < 140  Call [facility] to schedule your mammogram at [phone].

## 2023-08-28 NOTE — Assessment & Plan Note (Addendum)
 Blood sugars have been stable.  No recent hypoglycemic events requiring assistance.  Fasting BS < 130, mostly < 120. Currently medications are MTF and Farxiga . Lab Results  Component Value Date   HGBA1C 6.5 (A) 04/28/2023   Last visit no changes were made. A1C today = 6.7

## 2023-08-28 NOTE — Assessment & Plan Note (Signed)
 LDL is  Lab Results  Component Value Date   LDLCALC 69 12/13/2022   Current regimen is Crestor .  No medication side effects noted. Goal LDL is <70.

## 2023-09-04 ENCOUNTER — Other Ambulatory Visit: Payer: Self-pay | Admitting: Internal Medicine

## 2023-09-04 ENCOUNTER — Other Ambulatory Visit: Payer: Self-pay

## 2023-09-04 ENCOUNTER — Telehealth: Payer: Self-pay | Admitting: Internal Medicine

## 2023-09-04 DIAGNOSIS — E118 Type 2 diabetes mellitus with unspecified complications: Secondary | ICD-10-CM

## 2023-09-04 NOTE — Telephone Encounter (Unsigned)
 Copied from CRM [id number]. Topic: Clinical - Medication Refill >> Sep 04, 2023 12:38 PM Fonda T wrote: Medication: dapagliflozin  propanediol (FARXIGA ) 10 MG TABS tablet  Has the patient contacted their pharmacy? Yes (Agent: If no, request that the patient contact the pharmacy for the refill. If patient does not wish to contact the pharmacy document the reason why and proceed with request.) (Agent: If yes, when and what did the pharmacy advise?)  This is the patient's preferred pharmacy:  [facility] [phone] [place], [place] - [address] [place] [address] Phone: [phone] Fax: [phone]  Is this the correct pharmacy for this prescription? Yes If no, delete pharmacy and type the correct one.   Has the prescription been filled recently? Yes  Is the patient out of the medication? No, patient has about 1 week left, but patient will be out of town on a road trip  Has the patient been seen for an appointment in the last year OR does the patient have an upcoming appointment? Yes  Can we respond through MyChart? No, patient prefers phone call at [phone]  Agent: Please be advised that Rx refills may take up to 3 business days. We ask that you follow-up with your pharmacy.

## 2023-09-04 NOTE — Telephone Encounter (Signed)
Please review medication refill  request

## 2023-09-05 MED ORDER — DAPAGLIFLOZIN PROPANEDIOL 10 MG PO TABS: 10.0000 mg | ORAL_TABLET | Freq: Every day | ORAL | 1 refills | Status: AC

## 2023-10-14 ENCOUNTER — Ambulatory Visit: Admitting: Podiatry

## 2023-10-28 ENCOUNTER — Ambulatory Visit: Admitting: Podiatry

## 2023-10-28 ENCOUNTER — Encounter: Payer: Self-pay | Admitting: Podiatry

## 2023-10-28 VITALS — Ht 69.0 in | Wt 197.2 lb

## 2023-10-28 DIAGNOSIS — L97512 Non-pressure chronic ulcer of other part of right foot with fat layer exposed: Secondary | ICD-10-CM | POA: Diagnosis not present

## 2023-10-28 DIAGNOSIS — Q667 Congenital pes cavus, unspecified foot: Secondary | ICD-10-CM | POA: Diagnosis not present

## 2023-10-28 DIAGNOSIS — E08621 Diabetes mellitus due to underlying condition with foot ulcer: Secondary | ICD-10-CM | POA: Diagnosis not present

## 2023-10-28 LAB — HM DIABETES FOOT EXAM

## 2023-10-28 NOTE — Progress Notes (Signed)
   Chief Complaint  Patient presents with   Callouses    Pt is here due to callous on the right foot that needs to be shaved.    Subjective:  53 y.o. female with PMHx of diabetes mellitus, last A1c 08/28/2023 was 6.7, presenting for follow-up evaluation of a chronically symptomatic callus to the right forefoot plantar fifth MTP.  She does not go barefoot around the house.  She wears her diabetic shoes with Plastizote insoles daily with offloading felt padding.  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  Dermatology:  There continues to be chronic hyperkeratotic preulcerative callus lesion noted to the plantar aspect of the fifth MTP of the right foot.  After debridement there is a full-thickness ulcer noted extending into the subcutaneous tissue with a granular wound base measuring approximate 0.5 x 0.5 x 0.1 cm.  Periwound is callused.  No purulence.  No drainage.  It appears very stable and it does not probe to bone or joint  Vascular: Palpable pedal pulses bilaterally. No edema or erythema noted. Capillary refill within normal limits.  Neurological: Light touch and protective threshold diminished bilaterally.   Musculoskeletal Exam: High arches noted to the right foot with excessive lateral column loading.  Assessment: 1.  Ulcer plantar aspect of the fifth MTP right secondary to diabetes mellitus 2.  Preulcerative callus lesion plantar aspect of the fifth MTP right  3.  Diabetes mellitus w/ peripheral neuropathy 4.  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 - Medically necessary excisional debridement including  subcutaneous tissue was performed today using a 312 scalpel.  Excisional debridement of the necrotic nonviable tissue down to healthier bleeding viable tissue was performed with postdebridement measurement same as pre- -Continue diabetic shoes and insoles with offloading felt padding - Again today we had a discussion regarding the chronic nature of her preulcerative callus which has been ongoing for a few years now despite conservative care.   -Return to clinic in 3 months.  If she continues to have ulcerative development underlying this area I do recommend surgical intervention to potentially permanently alleviate this condition and pressure to the area  Thresa EMERSON Sar, DPM [facility]  Dr. Thresa EMERSON Sar, DPM    [address] [place], [place] [address]                Office [phone]  Fax [phone]

## 2023-10-30 ENCOUNTER — Other Ambulatory Visit: Payer: Self-pay | Admitting: Internal Medicine

## 2023-10-30 DIAGNOSIS — E118 Type 2 diabetes mellitus with unspecified complications: Secondary | ICD-10-CM

## 2023-11-03 NOTE — Telephone Encounter (Signed)
 Requested Prescriptions  Pending Prescriptions Disp Refills   metFORMIN  (GLUCOPHAGE -XR) 500 MG 24 hr tablet [Pharmacy Med Name: METFORMIN  HCL ER 500 MG TABLET] 270 tablet 0    Sig: TAKE 3 TABLETS (1,500 MG TOTAL) BY MOUTH DAILY BEFORE SUPPER.     Endocrinology:  Diabetes - Biguanides Failed - 11/03/2023  9:09 AM      Failed - B12 Level in normal range and within 720 days    No results found for: VITAMINB12       Passed - Cr in normal range and within 360 days    Creatinine, Ser  Date Value Ref Range Status  12/13/2022 0.89 0.57 - 1.00 mg/dL Final         Passed - HBA1C is between 0 and 7.9 and within 180 days    Hemoglobin A1C  Date Value Ref Range Status  08/28/2023 6.7 (A) 4.0 - 5.6 % Final   Hgb A1c MFr Bld  Date Value Ref Range Status  06/03/2022 7.9 (H) 4.8 - 5.6 % Final    Comment:             Prediabetes: 5.7 - 6.4          Diabetes: >6.4          Glycemic control for adults with diabetes: <7.0          Passed - eGFR in normal range and within 360 days    eGFR  Date Value Ref Range Status  12/13/2022 79 >59 mL/min/1.73 Final         Passed - Valid encounter within last 6 months    Recent Outpatient Visits           2 months ago Long term current use of oral hypoglycemic drug   [place] [facility] at [facility], Leita DEL, MD   6 months ago Type II diabetes mellitus with complication [facility])   [place] [facility] at [facility], Leita DEL, MD       Future Appointments             In 1 month Justus Leita DEL, MD [facility] at [facility]            Passed - CBC within normal limits and completed in the last 12 months    WBC  Date Value Ref Range Status  12/13/2022 9.7 3.4 - 10.8 x10E3/uL Final   RBC  Date Value Ref Range Status  12/13/2022 4.74 3.77 - 5.28 x10E6/uL Final   Hemoglobin  Date Value Ref Range Status  12/13/2022 14.3 11.1  - 15.9 g/dL Final   Hematocrit  Date Value Ref Range Status  12/13/2022 44.5 34.0 - 46.6 % Final   MCHC  Date Value Ref Range Status  12/13/2022 32.1 31.5 - 35.7 g/dL Final   [facility]  Date Value Ref Range Status  12/13/2022 30.2 26.6 - 33.0 pg Final   MCV  Date Value Ref Range Status  12/13/2022 94 79 - 97 fL Final   No results found for: PLTCOUNTKUC, LABPLAT, POCPLA RDW  Date Value Ref Range Status  12/13/2022 13.1 11.7 - 15.4 % Final

## 2023-11-12 ENCOUNTER — Other Ambulatory Visit: Payer: Self-pay | Admitting: Internal Medicine

## 2023-11-12 DIAGNOSIS — E1129 Type 2 diabetes mellitus with other diabetic kidney complication: Secondary | ICD-10-CM

## 2023-11-13 NOTE — Telephone Encounter (Signed)
 Requested medication (s) are due for refill today: yes  Requested medication (s) are on the active medication list: yes  Last refill:  08/18/23 #90   Future visit scheduled: yes  Notes to clinic:  overdue lab work    Requested Prescriptions  Pending Prescriptions Disp Refills   lisinopril  (ZESTRIL ) 5 MG tablet [Pharmacy Med Name: LISINOPRIL  5 MG TABLET] 90 tablet 0    Sig: TAKE 1 TABLET (5 MG TOTAL) BY MOUTH DAILY.     Cardiovascular:  ACE Inhibitors Failed - 11/13/2023  1:55 PM      Failed - Cr in normal range and within 180 days    Creatinine, Ser  Date Value Ref Range Status  12/13/2022 0.89 0.57 - 1.00 mg/dL Final         Failed - K in normal range and within 180 days    Potassium  Date Value Ref Range Status  12/13/2022 5.1 3.5 - 5.2 mmol/L Final         Passed - Patient is not pregnant      Passed - Last BP in normal range    BP Readings from Last 1 Encounters:  08/28/23 122/70         Passed - Valid encounter within last 6 months    Recent Outpatient Visits           2 months ago Long term current use of oral hypoglycemic drug   [place] [facility] at [facility], Leita DEL, MD   6 months ago Type II diabetes mellitus with complication [facility])   [place] [facility] at [facility], Leita DEL, MD       Future Appointments             In 1 month Justus, Leita DEL, MD [facility] at [facility], [phone] [place]

## 2023-11-15 ENCOUNTER — Other Ambulatory Visit: Payer: Self-pay | Admitting: Internal Medicine

## 2023-11-15 DIAGNOSIS — E1169 Type 2 diabetes mellitus with other specified complication: Secondary | ICD-10-CM

## 2023-11-18 NOTE — Telephone Encounter (Signed)
 Requested Prescriptions  Pending Prescriptions Disp Refills   rosuvastatin  (CRESTOR ) 5 MG tablet [Pharmacy Med Name: ROSUVASTATIN  CALCIUM  5 MG TAB] 90 tablet 0    Sig: TAKE 1 TABLET (5 MG TOTAL) BY MOUTH DAILY.     Cardiovascular:  Antilipid - Statins 2 Failed - 11/18/2023  8:39 AM      Failed - Lipid Panel in normal range within the last 12 months    Cholesterol, Total  Date Value Ref Range Status  12/13/2022 140 100 - 199 mg/dL Final   LDL Chol Calc (NIH)  Date Value Ref Range Status  12/13/2022 69 0 - 99 mg/dL Final   HDL  Date Value Ref Range Status  12/13/2022 41 >39 mg/dL Final   Triglycerides  Date Value Ref Range Status  12/13/2022 179 (H) 0 - 149 mg/dL Final         Passed - Cr in normal range and within 360 days    Creatinine, Ser  Date Value Ref Range Status  12/13/2022 0.89 0.57 - 1.00 mg/dL Final         Passed - Patient is not pregnant      Passed - Valid encounter within last 12 months    Recent Outpatient Visits           2 months ago Long term current use of oral hypoglycemic drug   [place] [facility] at [facility], Leita DEL, MD   6 months ago Type II diabetes mellitus with complication [facility])   [place] [facility] at [facility], Leita DEL, MD       Future Appointments             In 1 month Justus, Leita DEL, MD [facility] at [facility], [phone] [place]

## 2024-01-01 ENCOUNTER — Encounter: Payer: Self-pay | Admitting: Internal Medicine

## 2024-01-01 ENCOUNTER — Ambulatory Visit (INDEPENDENT_AMBULATORY_CARE_PROVIDER_SITE_OTHER): Admitting: Internal Medicine

## 2024-01-01 VITALS — BP 104/60 | HR 79 | Ht 69.0 in | Wt 194.0 lb

## 2024-01-01 DIAGNOSIS — M25511 Pain in right shoulder: Secondary | ICD-10-CM

## 2024-01-01 DIAGNOSIS — E118 Type 2 diabetes mellitus with unspecified complications: Secondary | ICD-10-CM | POA: Diagnosis not present

## 2024-01-01 DIAGNOSIS — Z1231 Encounter for screening mammogram for malignant neoplasm of breast: Secondary | ICD-10-CM | POA: Diagnosis not present

## 2024-01-01 DIAGNOSIS — G8929 Other chronic pain: Secondary | ICD-10-CM

## 2024-01-01 DIAGNOSIS — E1169 Type 2 diabetes mellitus with other specified complication: Secondary | ICD-10-CM

## 2024-01-01 DIAGNOSIS — Z23 Encounter for immunization: Secondary | ICD-10-CM | POA: Diagnosis not present

## 2024-01-01 DIAGNOSIS — E785 Hyperlipidemia, unspecified: Secondary | ICD-10-CM | POA: Diagnosis not present

## 2024-01-01 DIAGNOSIS — Z7984 Long term (current) use of oral hypoglycemic drugs: Secondary | ICD-10-CM

## 2024-01-01 DIAGNOSIS — Z0000 Encounter for general adult medical examination without abnormal findings: Secondary | ICD-10-CM | POA: Diagnosis not present

## 2024-01-01 NOTE — Patient Instructions (Addendum)
 Call [facility] to schedule your mammogram at [phone].   Try google  shoulder rotator cuff exercises  Schedule your eye exam

## 2024-01-01 NOTE — Assessment & Plan Note (Addendum)
 LDL is  Lab Results  Component Value Date   LDLCALC 69 12/13/2022   Current medication regimen is Crestor . Goal LDL is < 70.

## 2024-01-01 NOTE — Assessment & Plan Note (Addendum)
 Currently medications are MTF and Farxiga .  No hypoglycemic episodes noted. Home blood sugars in the 120 range. Last visit medical regimen changes were none. Lab Results  Component Value Date   HGBA1C 6.7 (A) 08/28/2023  She is reminded to schedule a diabetic eye exam - the last exam in 2023 showed retinopathy and she has noticed poor correction with her current lenses.

## 2024-01-01 NOTE — Progress Notes (Signed)
 Date:  01/01/2024   Name:  Sally Graham   DOB:  08/09/1970   MRN:  [id number]   Chief Complaint: Annual Exam Sally Graham is a 53 y.o. female who presents today for her Complete Annual Exam. She feels well. She reports exercising. She reports she is sleeping well. Breast complaints - none.  Health Maintenance  Topic Date Due   HIV Screening  Never done   DTaP/Tdap/Td vaccine (1 - Tdap) Never done   Hepatitis B Vaccine (1 of 3 - 19+ 3-dose series) Never done   Zoster (Shingles) Vaccine (1 of 2) Never done   Eye exam for diabetics  09/06/2022   Breast Cancer Screening  01/23/2023   Yearly kidney function blood test for diabetes  12/13/2023   Yearly kidney health urinalysis for diabetes  12/13/2023   Flu Shot  06/15/2024*   COVID-19 Vaccine (3 - Pfizer risk series) 09/12/2024*   Hemoglobin A1C  02/27/2024   Complete foot exam   10/27/2024   Cologuard (Stool DNA test)  07/15/2026   Pap with HPV screening  12/11/2026   Pneumococcal Vaccine for age over 75  Completed   Hepatitis C Screening  Completed   HPV Vaccine  Aged Out   Meningitis B Vaccine  Aged Out  *Topic was postponed. The date shown is not the original due date.    Diabetes She presents for her follow-up diabetic visit. She has type 2 diabetes mellitus. Her disease course has been stable. Pertinent negatives for hypoglycemia include no dizziness, headaches or nervousness/anxiousness. Pertinent negatives for diabetes include no chest pain, no fatigue and no weakness. Current diabetic treatments: MTF and Farxiga .  Hyperlipidemia This is a chronic problem. The problem is controlled. Pertinent negatives include no chest pain, myalgias or shortness of breath. Current antihyperlipidemic treatment includes statins. The current treatment provides significant improvement of lipids.  Shoulder Pain  The pain is present in the right shoulder. This is a chronic problem. The current episode started more than 1 year ago. There has  been no history of extremity trauma. The problem occurs daily. The problem has been unchanged. The quality of the pain is described as sharp. The pain is at a severity of 2/10. The pain is mild. Associated symptoms include a limited range of motion. Pertinent negatives include no joint locking, numbness or tingling. The symptoms are aggravated by activity. She has tried nothing for the symptoms.    Review of Systems  Constitutional:  Negative for fatigue and unexpected weight change.  HENT:  Negative for trouble swallowing.   Eyes:  Negative for visual disturbance.  Respiratory:  Negative for cough, chest tightness, shortness of breath and wheezing.   Cardiovascular:  Negative for chest pain, palpitations and leg swelling.  Gastrointestinal:  Negative for abdominal pain, constipation and diarrhea.  Genitourinary:  Negative for dysuria and hematuria.  Musculoskeletal:  Negative for arthralgias and myalgias.       Right shoulder limited ROM with intermittent discomfort  Neurological:  Negative for dizziness, tingling, weakness, light-headedness, numbness and headaches.  Psychiatric/Behavioral:  Negative for dysphoric mood and sleep disturbance. The patient is not nervous/anxious.      Lab Results  Component Value Date   NA 140 12/13/2022   K 5.1 12/13/2022   CO2 23 12/13/2022   GLUCOSE 137 (H) 12/13/2022   BUN 19 12/13/2022   CREATININE 0.89 12/13/2022   CALCIUM  10.3 (H) 12/13/2022   EGFR 79 12/13/2022   Lab Results  Component Value Date  CHOL 140 12/13/2022   HDL 41 12/13/2022   LDLCALC 69 12/13/2022   TRIG 179 (H) 12/13/2022   CHOLHDL 3.4 12/13/2022   Lab Results  Component Value Date   TSH 2.360 12/13/2022   Lab Results  Component Value Date   HGBA1C 6.7 (A) 08/28/2023   Lab Results  Component Value Date   WBC 9.7 12/13/2022   HGB 14.3 12/13/2022   HCT 44.5 12/13/2022   MCV 94 12/13/2022   PLT 396 12/13/2022   Lab Results  Component Value Date   ALT 13  12/13/2022   AST 17 12/13/2022   ALKPHOS 88 12/13/2022   BILITOT 0.8 12/13/2022   No results found for: MARIEN BOLLS, VD25OH   Patient Active Problem List   Diagnosis Date Noted   Hyperlipidemia associated with type 2 diabetes mellitus (HCC) 06/03/2022   Microalbuminuria due to type 2 diabetes mellitus (HCC) 07/11/2021   Type II diabetes mellitus with complication (HCC) 04/17/2021    No Known Allergies  Past Surgical History:  Procedure Laterality Date   CESAREAN SECTION  2000    Social History   Tobacco Use   Smoking status: Every Day    Current packs/day: 0.50    Average packs/day: 0.5 packs/day for 33.8 years (16.9 ttl pk-yrs)    Types: Cigarettes    Start date: 1992   Smokeless tobacco: Never  Vaping Use   Vaping status: Never Used  Substance Use Topics   Alcohol use: Not Currently    Alcohol/week: 3.0 - 4.0 standard drinks of alcohol    Comment: occassionally   Drug use: Never     Medication list has been reviewed and updated.  Current Meds  Medication Sig   ACCU-CHEK GUIDE test strip USE TO CHECK BLOOD SUGAR UP TO 4 TIMES DAILY   Accu-Chek Softclix Lancets lancets TEST UP TO FOUR TIMES DAILY   CINNAMON PO Take by mouth.   dapagliflozin  propanediol (FARXIGA ) 10 MG TABS tablet Take 1 tablet (10 mg total) by mouth daily.   lisinopril  (ZESTRIL ) 5 MG tablet TAKE 1 TABLET (5 MG TOTAL) BY MOUTH DAILY.   metFORMIN  (GLUCOPHAGE -XR) 500 MG 24 hr tablet TAKE 3 TABLETS (1,500 MG TOTAL) BY MOUTH DAILY BEFORE SUPPER.   Multiple Vitamins-Minerals (MULTIVIT/MULTIMINERAL ADULT PO) Take by mouth.   Omega-3 Fatty Acids (FISH OIL) 1000 MG CAPS Take by mouth.   rosuvastatin  (CRESTOR ) 5 MG tablet TAKE 1 TABLET (5 MG TOTAL) BY MOUTH DAILY.       01/01/2024    9:36 AM 08/28/2023   11:05 AM 04/28/2023    9:36 AM 12/13/2022    8:36 AM  GAD 7 : Generalized Anxiety Score  Nervous, Anxious, on Edge 0 0 0 0  Control/stop worrying 0 0 0 0  Worry too much - different  things 0 0 0 0  Trouble relaxing 0 0 0 0  Restless 0 0 0 0  Easily annoyed or irritable 0 0 0 0  Afraid - awful might happen 0 0 0 0  Total GAD 7 Score 0 0 0 0  Anxiety Difficulty Not difficult at all Not difficult at all Not difficult at all Not difficult at all       01/01/2024    9:36 AM 08/28/2023   11:05 AM 04/28/2023    9:36 AM  Depression screen PHQ 2/9  Decreased Interest 0 0 0  Down, Depressed, Hopeless 0 0 0  PHQ - 2 Score 0 0 0  Altered sleeping 0 0 0  Tired, decreased energy 0 0 0  Change in appetite 0 0 0  Feeling bad or failure about yourself  0 0 0  Trouble concentrating 0 0 0  Moving slowly or fidgety/restless 0 0 0  Suicidal thoughts 0 0 0  PHQ-9 Score 0 0 0  Difficult doing work/chores Not difficult at all Not difficult at all Not difficult at all    BP Readings from Last 3 Encounters:  01/01/24 104/60  08/28/23 122/70  04/28/23 108/68    Physical Exam Vitals and nursing note reviewed.  Constitutional:      General: She is not in acute distress.    Appearance: She is well-developed.  HENT:     Head: Normocephalic and atraumatic.     Right Ear: Tympanic membrane and ear canal normal.     Left Ear: Tympanic membrane and ear canal normal.     Nose:     Right Sinus: No maxillary sinus tenderness.     Left Sinus: No maxillary sinus tenderness.  Eyes:     General: No scleral icterus.       Right eye: No discharge.        Left eye: No discharge.     Conjunctiva/sclera: Conjunctivae normal.  Neck:     Thyroid: No thyromegaly.     Vascular: No carotid bruit.  Cardiovascular:     Rate and Rhythm: Normal rate and regular rhythm.     Pulses: Normal pulses.     Heart sounds: Normal heart sounds.  Pulmonary:     Effort: Pulmonary effort is normal. No respiratory distress.     Breath sounds: No wheezing.  Chest:  Breasts:    Right: No mass, nipple discharge, skin change or tenderness.     Left: No mass, nipple discharge, skin change or tenderness.   Abdominal:     General: Bowel sounds are normal.     Palpations: Abdomen is soft.     Tenderness: There is no abdominal tenderness.  Musculoskeletal:     Right shoulder: No swelling, deformity, bony tenderness or crepitus. Decreased range of motion. Normal strength.     Left shoulder: Normal.     Cervical back: Normal range of motion. No erythema.     Right lower leg: No edema.     Left lower leg: No edema.  Lymphadenopathy:     Cervical: No cervical adenopathy.  Skin:    General: Skin is warm and dry.     Findings: No rash.  Neurological:     Mental Status: She is alert and oriented to person, place, and time.     Cranial Nerves: No cranial nerve deficit.     Sensory: No sensory deficit.     Deep Tendon Reflexes: Reflexes are normal and symmetric.  Psychiatric:        Attention and Perception: Attention normal.        Mood and Affect: Mood and affect normal.        Cognition and Memory: Cognition normal.     Wt Readings from Last 3 Encounters:  01/01/24 194 lb (88 kg)  10/28/23 197 lb 4 oz (89.5 kg)  08/28/23 197 lb 4 oz (89.5 kg)    BP 104/60   Pulse 79   Ht 5' 9 (1.753 m)   Wt 194 lb (88 kg)   SpO2 98%   BMI 28.65 kg/m   Assessment and Plan:  Problem List Items Addressed This Visit       Unprioritized   Type  II diabetes mellitus with complication (HCC) (Chronic)   Currently medications are MTF and Farxiga .  No hypoglycemic episodes noted. Home blood sugars in the 120 range. Last visit medical regimen changes were none. Lab Results  Component Value Date   HGBA1C 6.7 (A) 08/28/2023  She is reminded to schedule a diabetic eye exam - the last exam in 2023 showed retinopathy and she has noticed poor correction with her current lenses.       Relevant Orders   CBC with Differential/Platelet   Comprehensive metabolic panel with GFR   Hemoglobin A1c   Microalbumin / creatinine urine ratio   TSH   Hyperlipidemia associated with type 2 diabetes mellitus  (HCC) (Chronic)   LDL is  Lab Results  Component Value Date   LDLCALC 69 12/13/2022   Current medication regimen is Crestor . Goal LDL is < 70.       Relevant Orders   Lipid panel   Other Visit Diagnoses       Annual physical exam    -  Primary   Cologuard negative this year. continue healthy diet and exercise Prevnar-20 today; consider Shingrix   Relevant Orders   CBC with Differential/Platelet   Comprehensive metabolic panel with GFR   Hemoglobin A1c   Lipid panel   Microalbumin / creatinine urine ratio   TSH     Encounter for screening mammogram for breast cancer       schedule at [facility]   Relevant Orders   MM 3D SCREENING MAMMOGRAM BILATERAL BREAST     Chronic right shoulder pain       may be mild rotator cuff injury by history and exam recommend home exercises and, if no improvement, see Dr. Alvia BLEAK     Encounter for immunization       Relevant Orders   Pneumococcal conjugate vaccine 20-valent (Completed)       Return in about 4 months (around 05/03/2024) for TOC , DM - Dr. Lemon.    Leita HILARIO Adie, MD [facility] [place]

## 2024-01-02 LAB — COMPREHENSIVE METABOLIC PANEL WITH GFR
ALT: 9 IU/L (ref 0–32)
AST: 18 IU/L (ref 0–40)
Albumin: 4.9 g/dL (ref 3.9–4.9)
Alkaline Phosphatase: 94 IU/L (ref 41–116)
BUN/Creatinine Ratio: 15 (ref 9–23)
BUN: 17 mg/dL (ref 6–24)
Bilirubin Total: 0.9 mg/dL (ref 0.0–1.2)
CO2: 23 mmol/L (ref 20–29)
Calcium: 10.5 mg/dL — ABNORMAL HIGH (ref 8.7–10.2)
Chloride: 103 mmol/L (ref 96–106)
Creatinine, Ser: 1.15 mg/dL — ABNORMAL HIGH (ref 0.57–1.00)
Globulin, Total: 2.6 g/dL (ref 1.5–4.5)
Glucose: 149 mg/dL — ABNORMAL HIGH (ref 70–99)
Potassium: 6 mmol/L — ABNORMAL HIGH (ref 3.5–5.2)
Sodium: 141 mmol/L (ref 134–144)
Total Protein: 7.5 g/dL (ref 6.0–8.5)
eGFR: 58 mL/min/1.73 — ABNORMAL LOW (ref >59)

## 2024-01-02 LAB — CBC WITH DIFFERENTIAL/PLATELET
Basophils Absolute: 0.1 x10E3/uL (ref 0.0–0.2)
Basos: 1 %
EOS (ABSOLUTE): 0.3 x10E3/uL (ref 0.0–0.4)
Eos: 4 %
Hematocrit: 44.5 % (ref 34.0–46.6)
Hemoglobin: 14.3 g/dL (ref 11.1–15.9)
Immature Grans (Abs): 0 x10E3/uL (ref 0.0–0.1)
Immature Granulocytes: 0 %
Lymphocytes Absolute: 2.3 x10E3/uL (ref 0.7–3.1)
Lymphs: 26 %
MCH: 30.4 pg (ref 26.6–33.0)
MCHC: 32.1 g/dL (ref 31.5–35.7)
MCV: 95 fL (ref 79–97)
Monocytes Absolute: 0.7 x10E3/uL (ref 0.1–0.9)
Monocytes: 7 %
Neutrophils Absolute: 5.4 x10E3/uL (ref 1.4–7.0)
Neutrophils: 62 %
Platelets: 338 x10E3/uL (ref 150–450)
RBC: 4.71 x10E6/uL (ref 3.77–5.28)
RDW: 12.5 % (ref 11.7–15.4)
WBC: 8.8 x10E3/uL (ref 3.4–10.8)

## 2024-01-02 LAB — LIPID PANEL
Chol/HDL Ratio: 3.9 ratio (ref 0.0–4.4)
Cholesterol, Total: 152 mg/dL (ref 100–199)
HDL: 39 mg/dL — ABNORMAL LOW (ref >39)
LDL Chol Calc (NIH): 84 mg/dL (ref 0–99)
Triglycerides: 168 mg/dL — ABNORMAL HIGH (ref 0–149)
VLDL Cholesterol Cal: 29 mg/dL (ref 5–40)

## 2024-01-02 LAB — TSH: TSH: 2.19 u[IU]/mL (ref 0.450–4.500)

## 2024-01-02 LAB — MICROALBUMIN / CREATININE URINE RATIO
Creatinine, Urine: 67.8 mg/dL
Microalb/Creat Ratio: 32 mg/g{creat} — ABNORMAL HIGH (ref 0–29)
Microalbumin, Urine: 21.5 ug/mL

## 2024-01-02 LAB — HEMOGLOBIN A1C
Est. average glucose Bld gHb Est-mCnc: 151 mg/dL
Hgb A1c MFr Bld: 6.9 % — ABNORMAL HIGH (ref 4.8–5.6)

## 2024-01-04 ENCOUNTER — Ambulatory Visit: Payer: Self-pay | Admitting: Internal Medicine

## 2024-01-04 DIAGNOSIS — E875 Hyperkalemia: Secondary | ICD-10-CM

## 2024-01-27 ENCOUNTER — Ambulatory Visit: Admitting: Podiatry

## 2024-01-28 ENCOUNTER — Other Ambulatory Visit: Payer: Self-pay | Admitting: Internal Medicine

## 2024-01-28 DIAGNOSIS — E118 Type 2 diabetes mellitus with unspecified complications: Secondary | ICD-10-CM

## 2024-01-29 NOTE — Telephone Encounter (Signed)
 Requested Prescriptions  Pending Prescriptions Disp Refills   metFORMIN  (GLUCOPHAGE -XR) 500 MG 24 hr tablet [Pharmacy Med Name: METFORMIN  HCL ER 500 MG TABLET] 270 tablet 0    Sig: TAKE 3 TABLETS (1,500 MG TOTAL) BY MOUTH DAILY BEFORE SUPPER.     Endocrinology:  Diabetes - Biguanides Failed - 01/29/2024  4:06 PM      Failed - Cr in normal range and within 360 days    Creatinine, Ser  Date Value Ref Range Status  01/01/2024 1.15 (H) 0.57 - 1.00 mg/dL Final         Failed - eGFR in normal range and within 360 days    eGFR  Date Value Ref Range Status  01/01/2024 58 (L) >59 mL/min/1.73 Final         Failed - B12 Level in normal range and within 720 days    No results found for: VITAMINB12       Passed - HBA1C is between 0 and 7.9 and within 180 days    Hgb A1c MFr Bld  Date Value Ref Range Status  01/01/2024 6.9 (H) 4.8 - 5.6 % Final    Comment:             Prediabetes: 5.7 - 6.4          Diabetes: >6.4          Glycemic control for adults with diabetes: <7.0          Passed - Valid encounter within last 6 months    Recent Outpatient Visits           4 weeks ago Annual physical exam   [place] [facility] at [facility], Leita DEL, MD   5 months ago Long term current use of oral hypoglycemic drug   [place] [facility] at [facility], Leita DEL, MD   9 months ago Type II diabetes mellitus with complication [facility])    Primary Care & Sports Medicine at [facility], Leita DEL, MD              Passed - CBC within normal limits and completed in the last 12 months    WBC  Date Value Ref Range Status  01/01/2024 8.8 3.4 - 10.8 x10E3/uL Final   RBC  Date Value Ref Range Status  01/01/2024 4.71 3.77 - 5.28 x10E6/uL Final   Hemoglobin  Date Value Ref Range Status  01/01/2024 14.3 11.1 - 15.9 g/dL Final   Hematocrit  Date Value Ref Range Status  01/01/2024 44.5 34.0 - 46.6 %  Final   MCHC  Date Value Ref Range Status  01/01/2024 32.1 31.5 - 35.7 g/dL Final   [facility]  Date Value Ref Range Status  01/01/2024 30.4 26.6 - 33.0 pg Final   MCV  Date Value Ref Range Status  01/01/2024 95 79 - 97 fL Final   No results found for: PLTCOUNTKUC, LABPLAT, POCPLA RDW  Date Value Ref Range Status  01/01/2024 12.5 11.7 - 15.4 % Final

## 2024-02-08 ENCOUNTER — Other Ambulatory Visit: Payer: Self-pay | Admitting: Internal Medicine

## 2024-02-08 DIAGNOSIS — E1129 Type 2 diabetes mellitus with other diabetic kidney complication: Secondary | ICD-10-CM

## 2024-02-09 NOTE — Telephone Encounter (Signed)
 Requested Prescriptions  Pending Prescriptions Disp Refills   lisinopril  (ZESTRIL ) 5 MG tablet [Pharmacy Med Name: LISINOPRIL  5 MG TABLET] 90 tablet 1    Sig: TAKE 1 TABLET (5 MG TOTAL) BY MOUTH DAILY.     Cardiovascular:  ACE Inhibitors Failed - 02/09/2024  4:56 PM      Failed - Cr in normal range and within 180 days    Creatinine, Ser  Date Value Ref Range Status  01/01/2024 1.15 (H) 0.57 - 1.00 mg/dL Final         Failed - K in normal range and within 180 days    Potassium  Date Value Ref Range Status  01/01/2024 6.0 (H) 3.5 - 5.2 mmol/L Final         Passed - Patient is not pregnant      Passed - Last BP in normal range    BP Readings from Last 1 Encounters:  01/01/24 104/60         Passed - Valid encounter within last 6 months    Recent Outpatient Visits           1 month ago Annual physical exam   [place] [facility] at [facility], Leita DEL, MD   5 months ago Long term current use of oral hypoglycemic drug   [place] [facility] at [facility], Leita DEL, MD   9 months ago Type II diabetes mellitus with complication [facility])   [place] [facility] at [facility], Leita DEL, MD

## 2024-02-12 ENCOUNTER — Other Ambulatory Visit: Payer: Self-pay | Admitting: Internal Medicine

## 2024-02-12 DIAGNOSIS — E1169 Type 2 diabetes mellitus with other specified complication: Secondary | ICD-10-CM

## 2024-02-17 ENCOUNTER — Encounter: Payer: Self-pay | Admitting: Podiatry

## 2024-02-17 ENCOUNTER — Ambulatory Visit: Admitting: Podiatry

## 2024-02-17 VITALS — Ht 69.0 in | Wt 194.0 lb

## 2024-02-17 DIAGNOSIS — L97512 Non-pressure chronic ulcer of other part of right foot with fat layer exposed: Secondary | ICD-10-CM

## 2024-02-17 DIAGNOSIS — E08621 Diabetes mellitus due to underlying condition with foot ulcer: Secondary | ICD-10-CM

## 2024-02-17 DIAGNOSIS — M216X1 Other acquired deformities of right foot: Secondary | ICD-10-CM | POA: Diagnosis not present

## 2024-02-17 NOTE — Telephone Encounter (Signed)
 Requested Prescriptions  Pending Prescriptions Disp Refills   rosuvastatin  (CRESTOR ) 5 MG tablet [Pharmacy Med Name: ROSUVASTATIN  CALCIUM  5 MG TAB] 90 tablet 3    Sig: TAKE 1 TABLET (5 MG TOTAL) BY MOUTH DAILY.     Cardiovascular:  Antilipid - Statins 2 Failed - 02/17/2024 11:20 AM      Failed - Cr in normal range and within 360 days    Creatinine, Ser  Date Value Ref Range Status  01/01/2024 1.15 (H) 0.57 - 1.00 mg/dL Final         Failed - Lipid Panel in normal range within the last 12 months    Cholesterol, Total  Date Value Ref Range Status  01/01/2024 152 100 - 199 mg/dL Final   LDL Chol Calc (NIH)  Date Value Ref Range Status  01/01/2024 84 0 - 99 mg/dL Final   HDL  Date Value Ref Range Status  01/01/2024 39 (L) >39 mg/dL Final   Triglycerides  Date Value Ref Range Status  01/01/2024 168 (H) 0 - 149 mg/dL Final         Passed - Patient is not pregnant      Passed - Valid encounter within last 12 months    Recent Outpatient Visits           1 month ago Annual physical exam   [place] [facility] at [facility], Leita DEL, MD   5 months ago Long term current use of oral hypoglycemic drug   [place] [facility] at [facility], Leita DEL, MD   9 months ago Type II diabetes mellitus with complication [facility])   [place] [facility] Medicine at [facility], Leita DEL, MD

## 2024-02-17 NOTE — Progress Notes (Signed)
   Chief Complaint  Patient presents with   Callouses    Pt is here to have callus shaved down on the right foot.    Subjective:  53 y.o. female with PMHx of diabetes mellitus Presenting for follow-up evaluation of a chronically symptomatic callus to the right forefoot plantar fifth MTP.  She does not go barefoot around the house.  She wears her diabetic shoes with Plastizote insoles daily with offloading felt padding.  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  Dermatology:  There continues to be chronic hyperkeratotic preulcerative callus lesion noted to the plantar aspect of the fifth MTP of the right foot.  After debridement there is a full-thickness ulcer noted extending into the subcutaneous tissue with a granular wound base measuring approximate 0.5 x 0.5 x 0.1 cm.  Periwound is callused.  No purulence.  No drainage.  It appears very stable and it does not probe to bone or joint  Vascular: Palpable pedal pulses bilaterally. No edema or erythema noted. Capillary refill within normal limits.  Neurological: Light touch and protective threshold diminished bilaterally.   Musculoskeletal Exam: High arches noted to the right foot with excessive lateral column loading.  Assessment: 1.  Ulcer plantar aspect of the fifth MTP right secondary to diabetes mellitus 2.  Preulcerative callus lesion plantar aspect of the fifth MTP right  3.  Diabetes mellitus w/ peripheral neuropathy 4.  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 - Medically necessary excisional debridement including subcutaneous tissue was performed today  using a 312 scalpel.  Excisional debridement of the necrotic nonviable tissue down to healthier bleeding viable tissue was performed with postdebridement measurement same as pre- -Continue diabetic shoes and insoles with offloading felt padding - Again today we had a discussion regarding the chronic nature of her preulcerative callus which has been ongoing for a few years now despite conservative care.   -Return to clinic in 3 months.  If she continues to have ulcerative development underlying this area I do recommend surgical intervention to potentially permanently alleviate this condition and pressure to the area.  The procedure would be a floating metatarsal osteotomy discussed in detail today including the postoperative recovery course  Thresa EMERSON Sar, DPM [facility]  Dr. Thresa EMERSON Sar, DPM    [address] [place], [place] [address]                Office [phone]  Fax [phone]

## 2024-05-07 ENCOUNTER — Ambulatory Visit: Admitting: Student

## 2024-05-18 ENCOUNTER — Ambulatory Visit: Admitting: Podiatry
